# Patient Record
Sex: Female | Born: 1985
Health system: Southern US, Community
[De-identification: ages and names within clinical notes are randomized; demographics above are authoritative.]

## PROBLEM LIST (undated history)

## (undated) ENCOUNTER — Inpatient Hospital Stay (HOSPITAL_COMMUNITY): Payer: Self-pay

## (undated) DIAGNOSIS — I456 Pre-excitation syndrome: Secondary | ICD-10-CM

## (undated) DIAGNOSIS — F419 Anxiety disorder, unspecified: Secondary | ICD-10-CM

## (undated) DIAGNOSIS — Z8659 Personal history of other mental and behavioral disorders: Secondary | ICD-10-CM

## (undated) DIAGNOSIS — A630 Anogenital (venereal) warts: Secondary | ICD-10-CM

## (undated) DIAGNOSIS — Z8619 Personal history of other infectious and parasitic diseases: Secondary | ICD-10-CM

## (undated) DIAGNOSIS — I499 Cardiac arrhythmia, unspecified: Secondary | ICD-10-CM

## (undated) DIAGNOSIS — F449 Dissociative and conversion disorder, unspecified: Secondary | ICD-10-CM

## (undated) HISTORY — PX: OTHER SURGICAL HISTORY: SHX169

## (undated) HISTORY — DX: Anxiety disorder, unspecified: F41.9

## (undated) HISTORY — PX: SHOULDER SURGERY: SHX246

## (undated) HISTORY — DX: Personal history of other infectious and parasitic diseases: Z86.19

## (undated) HISTORY — DX: Pre-excitation syndrome: I45.6

## (undated) HISTORY — PX: TONSILLECTOMY: SUR1361

## (undated) HISTORY — DX: Dissociative and conversion disorder, unspecified: F44.9

## (undated) HISTORY — DX: Anogenital (venereal) warts: A63.0

## (undated) HISTORY — PX: WISDOM TOOTH EXTRACTION: SHX21

## (undated) HISTORY — DX: Personal history of other mental and behavioral disorders: Z86.59

---
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
## 2004-02-28 ENCOUNTER — Other Ambulatory Visit: Admission: RE | Admit: 2004-02-28 | Discharge: 2004-02-28 | Payer: Self-pay | Admitting: Internal Medicine

## 2005-03-01 ENCOUNTER — Other Ambulatory Visit: Admission: RE | Admit: 2005-03-01 | Discharge: 2005-03-01 | Payer: Self-pay | Admitting: Internal Medicine

## 2006-03-18 ENCOUNTER — Other Ambulatory Visit: Admission: RE | Admit: 2006-03-18 | Discharge: 2006-03-18 | Payer: Self-pay | Admitting: Internal Medicine

## 2006-05-14 ENCOUNTER — Ambulatory Visit: Payer: Self-pay | Admitting: Internal Medicine

## 2007-07-16 ENCOUNTER — Ambulatory Visit: Payer: Self-pay | Admitting: Internal Medicine

## 2007-10-08 ENCOUNTER — Ambulatory Visit: Payer: Self-pay | Admitting: Internal Medicine

## 2007-10-08 LAB — CONVERTED CEMR LAB
BUN: 7 mg/dL (ref 6–23)
Basophils Absolute: 0 10*3/uL (ref 0.0–0.1)
Basophils Relative: 0.6 % (ref 0.0–1.0)
CO2: 30 meq/L (ref 19–32)
Calcium: 9.3 mg/dL (ref 8.4–10.5)
Chloride: 105 meq/L (ref 96–112)
Creatinine, Ser: 0.7 mg/dL (ref 0.4–1.2)
Eosinophils Absolute: 0.1 10*3/uL (ref 0.0–0.6)
Eosinophils Relative: 2 % (ref 0.0–5.0)
GFR calc Af Amer: 133 mL/min
GFR calc non Af Amer: 110 mL/min
Glucose, Bld: 92 mg/dL (ref 70–99)
HCT: 39.5 % (ref 36.0–46.0)
Hemoglobin: 13.3 g/dL (ref 12.0–15.0)
INR: 1 (ref 0.8–1.0)
Lymphocytes Relative: 50 % — ABNORMAL HIGH (ref 12.0–46.0)
MCHC: 33.6 g/dL (ref 30.0–36.0)
MCV: 91.9 fL (ref 78.0–100.0)
Monocytes Absolute: 0.4 10*3/uL (ref 0.2–0.7)
Monocytes Relative: 9.6 % (ref 3.0–11.0)
Neutro Abs: 1.6 10*3/uL (ref 1.4–7.7)
Neutrophils Relative %: 37.8 % — ABNORMAL LOW (ref 43.0–77.0)
Platelets: 274 10*3/uL (ref 150–400)
Potassium: 4.1 meq/L (ref 3.5–5.1)
Prothrombin Time: 12 s (ref 10.9–13.3)
RBC: 4.3 M/uL (ref 3.87–5.11)
RDW: 12 % (ref 11.5–14.6)
Sodium: 141 meq/L (ref 135–145)
WBC: 4.2 10*3/uL — ABNORMAL LOW (ref 4.5–10.5)
aPTT: 31.9 s — ABNORMAL HIGH (ref 21.7–29.8)
hCG, Beta Chain, Quant, S: 0.72 milliintl units/mL

## 2007-10-12 ENCOUNTER — Ambulatory Visit: Payer: Self-pay | Admitting: Internal Medicine

## 2007-10-12 ENCOUNTER — Ambulatory Visit (HOSPITAL_COMMUNITY): Admission: AD | Admit: 2007-10-12 | Discharge: 2007-10-13 | Payer: Self-pay | Admitting: Internal Medicine

## 2007-11-04 ENCOUNTER — Ambulatory Visit: Payer: Self-pay | Admitting: Internal Medicine

## 2008-01-07 ENCOUNTER — Other Ambulatory Visit: Admission: RE | Admit: 2008-01-07 | Discharge: 2008-01-07 | Payer: Self-pay | Admitting: Internal Medicine

## 2008-10-20 ENCOUNTER — Encounter: Admission: RE | Admit: 2008-10-20 | Discharge: 2008-10-20 | Payer: Self-pay | Admitting: Internal Medicine

## 2009-10-19 ENCOUNTER — Other Ambulatory Visit: Admission: RE | Admit: 2009-10-19 | Discharge: 2009-10-19 | Payer: Self-pay | Admitting: Geriatric Medicine

## 2010-12-18 NOTE — H&P (Signed)
[name], [name]               ACCOUNT NO.:  [id number]   MEDICAL RECORD NO.:  [id number]          PATIENT TYPE:  OIB   LOCATION:  2899                         FACILITY:  [facility]   PHYSICIAN:  Duke Salvia, MD, FACCDATE OF BIRTH:  May 21, 1986   DATE OF ADMISSION:  10/12/2007  DATE OF DISCHARGE:                              HISTORY & PHYSICAL   PRIMARY CARE PHYSICIAN:  Georgann Housekeeper, M.D.   HISTORY OF PRESENT ILLNESS:  [name] is a 25 year old African-American  female who presents to short stay at [facility] to undergo  ablation for SVT.   The patient describes a long history of palpitations, which she  describes as a fast heart rate. These occur at least every day. She  feels they occur mostly in the morning and after approximately 10  minutes of activity. They can last anywhere from a few seconds to up to  30 minutes in duration. She states that she is unaware of all episodes,  but sometimes only becomes aware of them when she goes back to normal  rhythm. When she notices that she is having a rapid heartbeat, she tries  to relax and does breathing exercises. She does not take any  medications. She has not had any associated syncope, however, she does  describe some associated shortness of breath and needle-like chest  discomfort with palpitations. When she is not having palpitations, she  does not have any complaints.   ALLERGIES:  POULTRY (develops a headache and feels very sick), SEAFOOD  (she has edema), PENICILLIN, AUGMENTIN, ORAP (a muscle relaxer.)   MEDICATIONS:  Prior to admission, she did not take any prescription  medications, although she has been on an acne topical treatment and she  was taking Z-pack last month for a sinusitis.   PAST MEDICAL HISTORY:  1. She has a history of hyperlipidemia that is untreated.  2. She has a history of SVT. Prior echocardiogram is not available.      Dr. Mayford Knife had performed a LOOP recorder in September 2008,  specifics not available.   PAST SURGICAL HISTORY:  1. T&A in December 2001.  2. Wisdom teeth removal.   She specifically denies any diabetes mellitus, hypertension, myocardial  infarction, CVA, COPD, bleeding dyscrasias, and thyroid dysfunction.   SOCIAL HISTORY:  She resides in [place] with her mother. 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 She  specifically denies any alcohol, tobacco, drugs, or herbal medications.  Does not follow a specific diet and does not exercise regularly. She  does not have any children.   FAMILY HISTORY:  She has a mother age 75, who also had a history of SVT.  Her father is 70 with a history of obesity, obstructive sleep apnea, and  hypertension. She has a sister who is alive and well.   REVIEW OF SYSTEMS:  In addition to the above is notable for glasses,  occasional outbreaks of eczema. Sinus infection last month. Anxiety  associated with school. GERD. Her last period was last week.   PHYSICAL EXAMINATION:  GENERAL:  A  well developed, well nourished,  pleasant, African-American female in no apparent distress. Mother is  present.  HEENT:  Unremarkable.  NECK:  Supple without thyromegaly, adenopathy, JVD, or carotid bruits.  CHEST:  Medical excursion. Clear to auscultation.  HEART:  PMI is not displaced. Regular rate and rhythm. Do not appreciate  any murmurs, rubs, clicks, or gallops. All pulses are symmetrical and  intact without abdominal or femoral bruits.  SKIN:  Integument also appears to be intact.  ABDOMEN:  Slight rounded Bowel sounds present without organomegaly,  masses, or tenderness.  EXTREMITIES:  Negative for clubbing, cyanosis, or edema.  MUSCULOSKELETAL/NEUROLOGIC:  Unremarkable.   DIAGNOSTIC STUDIES:  Chest x-ray has not been performed.   EKG in the office on October 08, 2007 shows normal sinus rhythm, normal  axis, with a ventricular rate of 75. She has an early R-wave. Old EKG's  are not  available.   LABORATORY DATA:  On October 08, 2007, hemoglobin and hematocrit was 13.5  and 39.5. Normal indices. Platelets 274,000. WBC is 4.2. Sodium 141,  potassium 4.1, BUN 7, creatinine 0.7, glucose 92, PTT 31.9, PT 12.0. INR  of 1.0. HCG was 0.72.   IMPRESSION:  1. Supraventricular tachycardia. History as noted previously.   DISPOSITION:  Dr. Graciela Husbands has spoken with the patient and her mother in  regards to procedure, risks, and benefits and they both agree to proceed  as planned with radio-frequency catheter ablation for her SVT.      Joellyn Rued, PA-C      Duke Salvia, MD, [facility]  Electronically Signed    EW/MEDQ  D:  10/12/2007  T:  10/12/2007  Job:  [id number]   cc:   Georgann Housekeeper, MD  Duke Salvia, MD, [facility]

## 2010-12-18 NOTE — Discharge Summary (Signed)
[name], TAKEMOTO               ACCOUNT NO.:  [id number]   MEDICAL RECORD NO.:  [id number]          PATIENT TYPE:  OIB   LOCATION:  2023                         FACILITY:  [facility]   PHYSICIAN:  Duke Salvia, MD, FACCDATE OF BIRTH:  [birth year]   DATE OF ADMISSION:  10/12/2007  DATE OF DISCHARGE:                               DISCHARGE SUMMARY   PRIMARY CARDIOLOGIST:  Dr. Graciela Husbands   PRIMARY CARE [name]:  Dr. Arther Dames   DISCHARGE DIAGNOSIS:  Wolff-Parkinson-White syndrome/supraventricular  tachycardia.   SECONDARY DIAGNOSES:  1. Hyperlipidemia.  2. Acne.  3. Status post wisdom tooth extraction.  4. Status post tonsillectomy and adenoidectomy.   ALLERGIES:  1. POULTRY CAUSES EDEMA AND HEADACHES.  2. PENICILLIN.  3. AUGMENTIN.  4. SEAFOOD CAUSES EDEMA.  5. ORAP.   PROCEDURES:  Successful WPW/SVT radiofrequency catheter ablation.   HISTORY OF PRESENT ILLNESS:  A 25 year old female with prior history of  SVT documented by loop recorder.  She presented to [facility] October 12, 2007, for elective radiofrequency catheter ablation with Dr. Graciela Husbands.   HOSPITAL COURSE:  The patient underwent successful radiofrequency  catheter ablation October 06, 2007 for WPW/SVT.  She tolerated procedure  well.  However, postprocedure, she developed a headache.  Because she  was anticoagulated during the procedure, CT of the head was performed,  and this was negative for any acute intracranial process.  She had no  recurrence of headache overnight or this morning and has been ambulating  without difficulty.  She will be discharged home today in good  condition.   DISCHARGE LABORATORIES:  None.   DISPOSITION:  The patient is being discharged today in good condition.   FOLLOWUP PLANS AND APPOINTMENT:  She is to follow up with Dr. Sherryl Manges November 04, 2007, at 11:30 a.m.   DISCHARGE MEDICATION:  Aspirin 81 mg daily x6 weeks.   OUTSTANDING LABORATORY STUDIES:  None.   Duration of discharge  encounter:  35 minutes including physician's time.      Nicolasa Ducking, ANP      Duke Salvia, MD, [facility]  Electronically Signed    CB/MEDQ  D:  10/13/2007  T:  10/13/2007  Job:  [id number]   cc:   Georgann Housekeeper, MD

## 2010-12-18 NOTE — Assessment & Plan Note (Signed)
[facility]                         ELECTROPHYSIOLOGY OFFICE NOTE   GRAE, LEATHERS                        MRN:          [id number]  DATE:11/04/2007                            DOB:          09/30/1985    Harvie Junior is seen following ablation of a far left lateral manifest  accessory pathway.  She has had no recurrent symptoms.  She has had no  recurrent tachypalpitations.  She is having occasional palpitations,  which she feels like are the triggering beats.  She was having  tachycardia previously a couple of times a day and has had none at all  the last 3 weeks.   She will continue her aspirin for 3 weeks, endocarditis prophylaxis for  3 weeks, and then resume her previous activities.   EXAMINATION:  Her lungs were clear.  Heart sounds were regular.  The groin had a knot in the right side.  Blood pressure is 98/64.     Duke Salvia, MD, [facility]  Electronically Signed    SCK/MedQ  DD: 11/04/2007  DT: 11/05/2007  Job #: [id number]   cc:   Armanda Magic, M.D.

## 2010-12-18 NOTE — Assessment & Plan Note (Signed)
[facility]                         ELECTROPHYSIOLOGY OFFICE NOTE   [name], [name]                        MRN:          [id number]  DATE:07/16/2007                            DOB:          May 07, 1986    [name] came in today because she wanted to think about getting  ablation for her SVT.  She had been led to believe by her mom that she  would be out for about five to six weeks.  I told her she would probably  be up in four or five days, at the most.  She would like to plan to  schedule this for her spring break, as she does not have time to  schedule it between now and her Christmas break.  She is to call us and  let us know what the dates are.   I should note that she has been having daily palpitations.   On examination today, her blood pressure is 110/70, her pulse is 75.  Her lungs were clear.  Her heart sounds were regular.  Extremities were without edema.   PLAN:  We will plan to see her again about a week or two prior to her  scheduled procedure.     Duke Salvia, MD, [facility]  Electronically Signed    SCK/MedQ  DD: 07/16/2007  DT: 07/17/2007  Job #: [id number]   cc:   Georgann Housekeeper, MD

## 2010-12-18 NOTE — Op Note (Signed)
[name], [name]               ACCOUNT NO.:  [id number]   MEDICAL RECORD NO.:  [id number]          PATIENT TYPE:  OIB   LOCATION:  2899                         FACILITY:  [facility]   PHYSICIAN:  Duke Salvia, MD, FACCDATE OF BIRTH:  [birth year]   DATE OF PROCEDURE:  10/12/2007  DATE OF DISCHARGE:                               OPERATIVE REPORT   PREOPERATIVE DIAGNOSIS:  Supraventricular tachycardia.   POSTOPERATIVE DIAGNOSIS:  Wolff-Parkinson-White syndrome with  bidirectionally conducting accessory pathway located on the left free  wall.   OPERATIVE PROCEDURE:  Invasive electrophysiological study, arrhythmia  mapping.   COMPLICATIONS:  1. The patient became very cold.  There is no evidence of a drug      reaction, no evidence of infringement on the movement of the      epicardial shadow.  2. Pain during ablation while burning in the left atrium to the mitral      annulus.   Following obtaining informed consent, the patient was brought to the  electrophysiology laboratory and placed on the fluoroscopic table in  supine position.  After routine prep and drape cardiac catheterization  was performed with local anesthesia and conscious sedation.  Noninvasive  blood pressure monitoring, transcutaneous oxygen saturation monitoring  and end-tidal CO2 monitoring were performed continuously throughout the  procedure.  Following the procedure, the catheters were removed, the  sheaths were left in place, the patient was transferred to holding area  in stable condition.   CATHETERS:  A 5-French quadripolar catheter was inserted via femoral  vein to the AV junction.  A 5-French quadripolar catheter inserted via femoral vein to the right  ventricular apex.  A 6-French octapolar catheter inserted via right femoral vein to the  coronary sinus.  A 7-French 5 mm tip ablation catheter was inserted via right femoral  artery to mapping sites on the mitral annulus.   It was difficult to  access the right femoral vein.  I actually took a  wire-o-gram with a wire deployed into the right femoral venous system  from left femoral venous system and it was not passing.  When I finally  got arterial access this wire was actually lateral in a small vein  branch.  Femoral venous access was then accomplished without further  difficulty.   Following insertion of the catheters, stimulation protocol included  Incremental atrial pacing.  Incremental ventricular pacing.  Single atrial extra stimuli at a paced cycle length of 600.  Single ventricular extra stimuli at a paced cycle length of 600 and 500  milliseconds.   RESULTS:  Surface cardiogram basic intervals  Initial:  Rhythm:  Sinus; RR interval:  820 milliseconds; PR interval:  49  milliseconds; QRS duration 68 milliseconds.  QT interval 386  milliseconds; P-wave duration 110 milliseconds; bundle branch block:  Absent; pre-excitation:  Absent.  AH interval 71 milliseconds; HV interval 54 milliseconds.  Final:  Rhythm:  Sinus; RR interval 771 milliseconds; PR interval 149  milliseconds; QRS duration 93 milliseconds; QT interval is 393  milliseconds; P-wave duration was 105 milliseconds.  AH interval 84 milliseconds; HV interval was 46 milliseconds.  Evidence  of bundle branch block and pre-excitation were not evident at baseline.   AV nodal conduction antegrade AV conduction Wenckebach greater than 400  milliseconds.  VA conduction was dissociated following catheter ablation  of accessory pathway.   AV nodal conduction was continuous antegrade and was notably dissociated  post ablation.   ACCESSORY PATHWAY:  A bidirectionally conducting accessory pathway was  identified at Josephson site 6.  Antegrade ERP was at 280 milliseconds  at pace cycle length of 500 milliseconds.  The ERP for premature was 209  milliseconds and retrograde was at 300 milliseconds.   With incremental atrial pacing pre-excitation became manifest  that was  not evident at baseline.  At 700 milliseconds the HV interval was -46  milliseconds at 605 milliseconds was -77 milliseconds.   Orthodromic SVT was initiated via programmed stimulation from the  coronary sinus at 600:300 milliseconds.  Tachycardia cycle length was  approximately 350 milliseconds.  Earliest atrial activation was in the  lateral coronary sinus but VA fusion was not noted.   FLUOROSCOPY TIME:  A total of 11 minutes and 34 seconds of fluoroscopy  time was utilized at 7.5 frames per second.   RADIOFREQUENCY ENERGY:  A total of six burns for total of about 75  seconds of RF were applied at sites where atrial activation was  earliest.  Ultimately I manipulated the catheter to the left atrial site  of the mitral annulus which allowed for more ready movement of the  catheter along the annulus.  In this location RF delivered at the site  where the earliest atrial activation was noted and a possible accessory  pathway potential was identified, was associated with termination of  retrograde conduction at 5.2 seconds.  A total of 30 seconds of RF was  delivered at this point.  Antegrade conduction was also noted to be lost  when atrial pacing was tested.   IMPRESSION:  1. Normal sinus function.  2. Normal atrial function.  3. Normal AV nodal function.  4. Normal His-Purkinje system function.  5. A left lateral bidirectionally conducting accessory pathway was      identified that mediated orthodromic SVT.  RF energy applied on the      atrial side mitral annulus successfully interrupted bidirectional      conduction and eliminated the substrate.   SUMMARY:  In conclusion, results electrophysiological testing identified  a bidirectionally conducting accessory pathway on the patient's left  lateral free wall.  Catheter manipulation made it difficult to approach  this site below the mitral annulus and so catheter ablation was pursued  above the mitral annulus with the  success as noted previously.   The patient tolerated the procedure, apart from getting (a) cold and (b)  having take pain during one RF application of about 5 seconds duration  where I think that I was too far into the atrium.   Otherwise the patient tolerated the procedure without apparent  complication and was transferred to the holding area for sheath removal.      Duke Salvia, MD, [facility]  Electronically Signed     SCK/MEDQ  D:  10/12/2007  T:  10/13/2007  Job:  [id number]   cc:   Armanda Magic, M.D.  Electrophys lab

## 2010-12-21 NOTE — Letter (Signed)
May 14, 2006    Armanda Magic, M.D.  [address]  [place], [place] [address]   RE:  Kayla, [name]  MRN:  [id number]  /  DOB:  1986-07-31   Dear Gloris Manchester,   It is a pleasure to see [name] today with her mom. Both of them have  a history of tachy palpitations that are abrupt in onset and offset.   Apparently [name] were identified when she was in utero and they have  occurred all of her life since then. They currently occur 2-5 times per  week, they are abrupt in onset and offset and they last 3-90 minutes or so.  They are associated with light-headedness and some presyncope. She has never  passed out. They are associated with some chest discomfort and shortness of  breath.   I meant to ask her and unfortunately did not as to whether she had taken  medications in the past for this, but when I mentioned medication as an  option she said that was not something she wanted to do and so I assumed  that she had not.   She notes that there is some effect from caffeine. She does not use over-the-  counter cold medicines but she does note some stimulants can aggravate this.   Her cardiac evaluation has included an echocardiogram which per her report  was normal.   We also utilized an event recorded which was diagnostic and I will describe  this below.   PAST MEDICAL HISTORY:  Largely negative apart from the above. Her review of  systems is notable for environmental allergies.   PAST SURGICAL HISTORY:  Notable for tonsillectomy and wisdom tooth surgery.   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 She does not use cigarettes, alcohol or  recreational drugs and she has no children.   FAMILY HISTORY:  Notable for her mother who has similar tachy palpitations  that have been going on for the last 5 or 10 years.   MEDICATIONS:  Singulair, Zyrtec and Nasonex p.r.n.   ALLERGIES:  PENICILLIN.   PHYSICAL  EXAMINATION:  GENERAL:  She is a healthy-appearing, young, African-  American female appearing her stated age of 80.  VITAL SIGNS:  Her blood pressure was 108/74, her pulse was 75, her weight  was 153 pounds.  HEENT:  Demonstrated no __________ xanthomata.  NECK:  Neck veins were flat. Carotids were brisk and full bilaterally  without bruits.  BACK:  Without kyphosis or scoliosis.  LUNGS:  Clear.  HEART:  Sounds were regular without murmurs or gallops.  ABDOMEN:  Soft with active bowel sounds without midline pulsation or  hepatomegaly.  EXTREMITIES:  Femoral pulses were 2+, distal pulses were intact. There was  no cyanosis, clubbing or edema.  NEUROLOGIC:  Grossly normal.  SKIN:  Warm and dry.   Her electrocardiogram today demonstrated sinus rhythm without evidence of  ventricular preexcitation, the intervals were 0.13/0.08/0.38.   The vent loop recorder dated September demonstrated a narrow QRS tachycardia  the onset of which was associated with a PAC that was aberrantly conducted.  There was some acceleration of the tachycardia with a loss of aberration  that appeared to be a left bundle branch block. This may have also been  related to the initiation.   Another episode of tachycardia was identified, the onset was lost in  artifact. The cycle length was about 300 msec.  IMPRESSION:  1. Recurrent supraventricular tachycardia probably concealed left-sided      accessory pathway based on a) statistical likelihood as well as b)      cycle length diminution with loss of aberration that appears to be a      left bundle branch block aberration.  2. Otherwise healthy heart.   [name], her mother and I had a lengthy discussion regarding the presumed  mechanism underlying her arrhythmia. We discussed treatment options  including a) doing nothing, b) p.r.n. or daily AV nodal blocking agents i.e.  calcium blockers or beta blockers, the use of antiarrhythmic drugs and their  potential  for proarrhythmia as well as electrophysiological study with  radiofrequency catheter ablation. We discussed potential successes in the 95-  98% range, the likelihood of recurrence in the __________% range, the  likelihood of a catastrophic complication including heart attack, stroke  and/or dying at 08/998, heart block at about 1/100 and perforation in the  range of 1 in 250 to 350. She would like to consider this. We gave her the  teaching sheets from the [facility] on SVT and catheter  ablation. She is to contact us if she would like to pursue further  treatments.   I have reviewed the same things with her mother who probably has AV nodal  reentry based on her symptoms.   If I can assist further in their care, please do not hesitate to contact me  and thank you very much for allowing me to meet them and talk with them.    Sincerely,     ______________________________  Duke Salvia, MD, [facility]    SCK/MedQ  /  Job #:  [id number]  DD:  05/14/2006 / DT:  05/16/2006   CC:    Georgann Housekeeper, MD

## 2010-12-21 NOTE — Op Note (Signed)
[place]. [facility]  Patient:    [name], [name]                        MRN: [id number] Proc. Date: 07/25/00 Adm. Date:  [id number] Attending:  Lucky Cowboy CC:         [facility]  Linward Headland, M.D.   Operative Report  PREOPERATIVE DIAGNOSIS:  Chronic tonsillitis.  POSTOPERATIVE DIAGNOSIS:  Chronic tonsillitis.  PROCEDURE:  Tonsillectomy.  SURGEON:  Lucky Cowboy, M.D.  ANESTHESIA:  General endotracheal anesthesia.  ESTIMATED BLOOD LOSS:  10 cc.  SPECIMENS:  Tonsils.  COMPLICATIONS:  None.  INDICATIONS:  This patient is a 25 year old female, who has had at least a 2-year history of chronic sore throat.  She has been found to have chronic tonsillitis with some cryptic debris.  For these reasons, tonsillectomy was performed.  FINDINGS:  Nasopharynx was inspected and found to have no amount of adenoid hypertrophy.  There was significant intranasal edema of the turbinates.  There were a 3+ tonsils, which were cryptic and contained a significant amount of tonsillar debris.  The Harmonic scalpel was used to remove the tonsils.  PROCEDURE:  The patient was taken to the operating room and placed on the table in a supine position.  She was then placed under general endotracheal anesthesia and the table rotated counterclockwise 90 degrees.  The neck was then gently extended using a shoulder roll.  Bacitracin ointment was placed on the lips.  A Crowe-Davis mouthgag with a #3 tongue blade was then placed intraorally, opened and suspended on the Mayo stand.  Palpation of the soft palate was without evidence of a submucosal cleft.  A red rubber catheter was placed down the right nostril and brought through the oral cavity and secured in place with a hemostat.  Inspection of the nasopharynx was performed and the findings were noted as above.  The palate was relaxed and the right palatine tonsil grasped with a Allis clamp and reflected  inferomedially.  The Harmonic scalpel was then used to excise the tonsil in the peritonsillar space staying adjacent to the tonsillar capsule.  The left tonsil was removed in an identical fashion.  An NG tube was placed down the esophagus for suctioning of the gastric contents.  Mouthgag was removed noting no damage to the teeth or soft tissues.  The table was rotated clockwise 90 degrees to its original position.  The patient was awakened from anesthesia and extubated in the operating room.  She was taken to the post anesthesia care unit in stable condition.  There were no complications. DD:  07/25/00 TD:  07/26/00 Job: [id number]

## 2012-08-05 NOTE — L&D Delivery Note (Signed)
Delivery Note At 1:05 PM a viable female was delivered via Vaginal, Spontaneous Delivery (Presentation: Left Occiput Anterior).  APGAR: 8, 9; weight .   Placenta status: Intact, Spontaneous.  Cord: 3 vessels with the following complications: None.  Cord pH: NA  Anesthesia: Epidural  Episiotomy: None Lacerations: 2nd degree;Perineal;Periurethral Suture Repair: 3.0 vicryl Est. Blood Loss (mL): 200 ml   Mom to postpartum.  Baby to nursery-stable.  Lakela Kuba J. 02/23/2013, 1:35 PM

## 2012-08-12 LAB — OB RESULTS CONSOLE HIV ANTIBODY (ROUTINE TESTING): HIV: NONREACTIVE

## 2012-08-12 LAB — OB RESULTS CONSOLE ABO/RH: RH Type: POSITIVE

## 2012-08-12 LAB — OB RESULTS CONSOLE HEPATITIS B SURFACE ANTIGEN: Hepatitis B Surface Ag: NEGATIVE

## 2012-08-12 LAB — OB RESULTS CONSOLE RUBELLA ANTIBODY, IGM: Rubella: IMMUNE

## 2012-08-12 LAB — OB RESULTS CONSOLE RPR: RPR: NONREACTIVE

## 2012-08-12 LAB — OB RESULTS CONSOLE ANTIBODY SCREEN: Antibody Screen: NEGATIVE

## 2012-08-19 ENCOUNTER — Other Ambulatory Visit (HOSPITAL_COMMUNITY)
Admission: RE | Admit: 2012-08-19 | Discharge: 2012-08-19 | Disposition: A | Discharge status: Home / Self Care | Payer: BC Managed Care – PPO | Source: Ambulatory Visit | Attending: Obstetrics and Gynecology | Admitting: Obstetrics and Gynecology

## 2012-08-19 ENCOUNTER — Other Ambulatory Visit: Payer: Self-pay | Admitting: Obstetrics and Gynecology

## 2012-08-19 DIAGNOSIS — R87810 Cervical high risk human papillomavirus (HPV) DNA test positive: Secondary | ICD-10-CM | POA: Insufficient documentation

## 2012-08-19 DIAGNOSIS — Z113 Encounter for screening for infections with a predominantly sexual mode of transmission: Secondary | ICD-10-CM | POA: Insufficient documentation

## 2012-08-19 DIAGNOSIS — Z1151 Encounter for screening for human papillomavirus (HPV): Secondary | ICD-10-CM | POA: Insufficient documentation

## 2012-08-19 DIAGNOSIS — Z01419 Encounter for gynecological examination (general) (routine) without abnormal findings: Secondary | ICD-10-CM | POA: Insufficient documentation

## 2013-01-13 LAB — OB RESULTS CONSOLE GBS: GBS: POSITIVE

## 2013-02-13 ENCOUNTER — Inpatient Hospital Stay (HOSPITAL_COMMUNITY): Admission: AD | Admit: 2013-02-13 | Payer: Self-pay | Source: Ambulatory Visit | Admitting: Obstetrics and Gynecology

## 2013-02-15 ENCOUNTER — Telehealth (HOSPITAL_COMMUNITY): Payer: Self-pay | Admitting: *Deleted

## 2013-02-15 NOTE — Telephone Encounter (Signed)
Preadmission screen  

## 2013-02-17 ENCOUNTER — Encounter (HOSPITAL_COMMUNITY): Payer: Self-pay | Admitting: *Deleted

## 2013-02-18 ENCOUNTER — Other Ambulatory Visit (HOSPITAL_COMMUNITY): Payer: Self-pay | Admitting: Obstetrics and Gynecology

## 2013-02-18 DIAGNOSIS — O480 Post-term pregnancy: Secondary | ICD-10-CM

## 2013-02-19 ENCOUNTER — Ambulatory Visit (HOSPITAL_COMMUNITY): Payer: BC Managed Care – PPO

## 2013-02-19 ENCOUNTER — Ambulatory Visit (HOSPITAL_COMMUNITY)
Admission: RE | Admit: 2013-02-19 | Discharge: 2013-02-19 | Disposition: A | Discharge status: Home / Self Care | Payer: BC Managed Care – PPO | Source: Ambulatory Visit | Attending: Obstetrics and Gynecology | Admitting: Obstetrics and Gynecology

## 2013-02-19 ENCOUNTER — Other Ambulatory Visit (HOSPITAL_COMMUNITY): Payer: Self-pay | Admitting: Obstetrics and Gynecology

## 2013-02-19 DIAGNOSIS — Z3689 Encounter for other specified antenatal screening: Secondary | ICD-10-CM | POA: Insufficient documentation

## 2013-02-19 DIAGNOSIS — O480 Post-term pregnancy: Secondary | ICD-10-CM | POA: Insufficient documentation

## 2013-02-22 ENCOUNTER — Encounter (HOSPITAL_COMMUNITY): Payer: Self-pay

## 2013-02-22 ENCOUNTER — Inpatient Hospital Stay (HOSPITAL_COMMUNITY)
Admission: RE | Admit: 2013-02-22 | Discharge: 2013-02-25 | DRG: 373 | Disposition: A | Discharge status: Home / Self Care | Payer: BC Managed Care – PPO | Source: Ambulatory Visit | Attending: Obstetrics and Gynecology | Admitting: Obstetrics and Gynecology

## 2013-02-22 ENCOUNTER — Other Ambulatory Visit: Payer: Self-pay | Admitting: Obstetrics and Gynecology

## 2013-02-22 DIAGNOSIS — O99344 Other mental disorders complicating childbirth: Secondary | ICD-10-CM | POA: Diagnosis present

## 2013-02-22 DIAGNOSIS — Z22330 Carrier of Group B streptococcus: Secondary | ICD-10-CM

## 2013-02-22 DIAGNOSIS — O480 Post-term pregnancy: Principal | ICD-10-CM | POA: Diagnosis present

## 2013-02-22 DIAGNOSIS — IMO0001 Reserved for inherently not codable concepts without codable children: Secondary | ICD-10-CM

## 2013-02-22 DIAGNOSIS — F29 Unspecified psychosis not due to a substance or known physiological condition: Secondary | ICD-10-CM | POA: Diagnosis present

## 2013-02-22 DIAGNOSIS — O99892 Other specified diseases and conditions complicating childbirth: Secondary | ICD-10-CM | POA: Diagnosis present

## 2013-02-22 HISTORY — DX: Cardiac arrhythmia, unspecified: I49.9

## 2013-02-22 LAB — CBC
HCT: 36.4 % (ref 36.0–46.0)
Hemoglobin: 12.9 g/dL (ref 12.0–15.0)
MCH: 31.5 pg (ref 26.0–34.0)
MCHC: 35.4 g/dL (ref 30.0–36.0)
MCV: 88.8 fL (ref 78.0–100.0)
Platelets: 186 10*3/uL (ref 150–400)
RBC: 4.1 MIL/uL (ref 3.87–5.11)
RDW: 14.1 % (ref 11.5–15.5)
WBC: 10.5 10*3/uL (ref 4.0–10.5)

## 2013-02-22 MED ORDER — CLINDAMYCIN PHOSPHATE 900 MG/50ML IV SOLN
900.0000 mg | Freq: Three times a day (TID) | INTRAVENOUS | Status: DC
  Administered 2013-02-22 – 2013-02-23 (×2): 900 mg via INTRAVENOUS
  Filled 2013-02-22 (×4): qty 50

## 2013-02-22 MED ORDER — BUTORPHANOL TARTRATE 1 MG/ML IJ SOLN
1.0000 mg | INTRAMUSCULAR | Status: DC | PRN
  Administered 2013-02-23 (×2): 1 mg via INTRAVENOUS
  Filled 2013-02-22 (×2): qty 1

## 2013-02-22 MED ORDER — MISOPROSTOL 25 MCG QUARTER TABLET
25.0000 ug | ORAL_TABLET | ORAL | Status: DC | PRN
  Administered 2013-02-22: 25 ug via VAGINAL
  Filled 2013-02-22: qty 1
  Filled 2013-02-22: qty 0.25

## 2013-02-22 MED ORDER — IBUPROFEN 600 MG PO TABS
600.0000 mg | ORAL_TABLET | Freq: Four times a day (QID) | ORAL | Status: DC | PRN
  Administered 2013-02-23: 600 mg via ORAL
  Filled 2013-02-22: qty 1

## 2013-02-22 MED ORDER — OXYTOCIN BOLUS FROM INFUSION: 500.0000 mL | INTRAVENOUS | Status: DC

## 2013-02-22 MED ORDER — PENICILLIN G POTASSIUM 5000000 UNITS IJ SOLR
5.0000 10*6.[IU] | Freq: Once | INTRAVENOUS | Status: DC
  Filled 2013-02-22: qty 5

## 2013-02-22 MED ORDER — PENICILLIN G POTASSIUM 5000000 UNITS IJ SOLR
2.5000 10*6.[IU] | INTRAVENOUS | Status: DC
  Filled 2013-02-22 (×2): qty 2.5

## 2013-02-22 MED ORDER — OXYTOCIN 40 UNITS IN LACTATED RINGERS INFUSION - SIMPLE MED
62.5000 mL/h | INTRAVENOUS | Status: DC
  Administered 2013-02-23: 999 mL/h via INTRAVENOUS

## 2013-02-22 MED ORDER — ACETAMINOPHEN 325 MG PO TABS: 650.0000 mg | ORAL_TABLET | ORAL | Status: DC | PRN

## 2013-02-22 MED ORDER — CITRIC ACID-SODIUM CITRATE 334-500 MG/5ML PO SOLN: 30.0000 mL | ORAL | Status: DC | PRN

## 2013-02-22 MED ORDER — LACTATED RINGERS IV SOLN
INTRAVENOUS | Status: DC
  Administered 2013-02-22 – 2013-02-23 (×3): via INTRAVENOUS

## 2013-02-22 MED ORDER — TERBUTALINE SULFATE 1 MG/ML IJ SOLN: 0.2500 mg | Freq: Once | INTRAMUSCULAR | Status: AC | PRN

## 2013-02-22 MED ORDER — LACTATED RINGERS IV SOLN: 500.0000 mL | INTRAVENOUS | Status: DC | PRN

## 2013-02-22 MED ORDER — LIDOCAINE HCL (PF) 1 % IJ SOLN
30.0000 mL | INTRAMUSCULAR | Status: DC | PRN
  Filled 2013-02-22: qty 30

## 2013-02-22 MED ORDER — ONDANSETRON HCL 4 MG/2ML IJ SOLN
4.0000 mg | Freq: Four times a day (QID) | INTRAMUSCULAR | Status: DC | PRN
  Administered 2013-02-23: 4 mg via INTRAVENOUS
  Filled 2013-02-22: qty 2

## 2013-02-22 MED ORDER — OXYCODONE-ACETAMINOPHEN 5-325 MG PO TABS: 1.0000 | ORAL_TABLET | ORAL | Status: DC | PRN

## 2013-02-23 ENCOUNTER — Inpatient Hospital Stay (HOSPITAL_COMMUNITY): Payer: BC Managed Care – PPO | Admitting: Anesthesiology

## 2013-02-23 ENCOUNTER — Encounter (HOSPITAL_COMMUNITY): Payer: Self-pay

## 2013-02-23 ENCOUNTER — Encounter (HOSPITAL_COMMUNITY): Payer: Self-pay | Admitting: Anesthesiology

## 2013-02-23 DIAGNOSIS — IMO0001 Reserved for inherently not codable concepts without codable children: Secondary | ICD-10-CM

## 2013-02-23 LAB — RPR: RPR Ser Ql: NONREACTIVE

## 2013-02-23 MED ORDER — EPHEDRINE 5 MG/ML INJ
10.0000 mg | INTRAVENOUS | Status: DC | PRN
  Filled 2013-02-23: qty 4
  Filled 2013-02-23: qty 2

## 2013-02-23 MED ORDER — ONDANSETRON HCL 4 MG PO TABS: 4.0000 mg | ORAL_TABLET | ORAL | Status: DC | PRN

## 2013-02-23 MED ORDER — ONDANSETRON HCL 4 MG/2ML IJ SOLN: 4.0000 mg | INTRAMUSCULAR | Status: DC | PRN

## 2013-02-23 MED ORDER — TETANUS-DIPHTH-ACELL PERTUSSIS 5-2.5-18.5 LF-MCG/0.5 IM SUSP
0.5000 mL | Freq: Once | INTRAMUSCULAR | Status: AC
  Administered 2013-02-24: 0.5 mL via INTRAMUSCULAR
  Filled 2013-02-23: qty 0.5

## 2013-02-23 MED ORDER — PHENYLEPHRINE 40 MCG/ML (10ML) SYRINGE FOR IV PUSH (FOR BLOOD PRESSURE SUPPORT)
80.0000 ug | PREFILLED_SYRINGE | INTRAVENOUS | Status: DC | PRN
  Filled 2013-02-23: qty 5
  Filled 2013-02-23: qty 2

## 2013-02-23 MED ORDER — EPHEDRINE 5 MG/ML INJ
10.0000 mg | INTRAVENOUS | Status: DC | PRN
  Filled 2013-02-23: qty 2

## 2013-02-23 MED ORDER — LACTATED RINGERS IV SOLN
500.0000 mL | Freq: Once | INTRAVENOUS | Status: AC
  Administered 2013-02-23: 500 mL via INTRAVENOUS

## 2013-02-23 MED ORDER — TERBUTALINE SULFATE 1 MG/ML IJ SOLN: 0.2500 mg | Freq: Once | INTRAMUSCULAR | Status: DC | PRN

## 2013-02-23 MED ORDER — METHYLERGONOVINE MALEATE 0.2 MG/ML IJ SOLN: 0.2000 mg | INTRAMUSCULAR | Status: DC | PRN

## 2013-02-23 MED ORDER — LANOLIN HYDROUS EX OINT: TOPICAL_OINTMENT | CUTANEOUS | Status: DC | PRN

## 2013-02-23 MED ORDER — FENTANYL 2.5 MCG/ML BUPIVACAINE 1/10 % EPIDURAL INFUSION (WH - ANES)
14.0000 mL/h | INTRAMUSCULAR | Status: DC | PRN
  Administered 2013-02-23: 14 mL/h via EPIDURAL
  Filled 2013-02-23: qty 125

## 2013-02-23 MED ORDER — DIBUCAINE 1 % RE OINT
1.0000 "application " | TOPICAL_OINTMENT | RECTAL | Status: DC | PRN
  Administered 2013-02-23: 1 via RECTAL
  Filled 2013-02-23: qty 28

## 2013-02-23 MED ORDER — DIPHENHYDRAMINE HCL 50 MG/ML IJ SOLN: 12.5000 mg | INTRAMUSCULAR | Status: DC | PRN

## 2013-02-23 MED ORDER — LIDOCAINE HCL (PF) 1 % IJ SOLN
INTRAMUSCULAR | Status: DC | PRN
  Administered 2013-02-23 (×4): 4 mL

## 2013-02-23 MED ORDER — OXYCODONE-ACETAMINOPHEN 5-325 MG PO TABS: 1.0000 | ORAL_TABLET | ORAL | Status: DC | PRN

## 2013-02-23 MED ORDER — PHENYLEPHRINE 40 MCG/ML (10ML) SYRINGE FOR IV PUSH (FOR BLOOD PRESSURE SUPPORT)
80.0000 ug | PREFILLED_SYRINGE | INTRAVENOUS | Status: DC | PRN
  Filled 2013-02-23: qty 2

## 2013-02-23 MED ORDER — WITCH HAZEL-GLYCERIN EX PADS
1.0000 "application " | MEDICATED_PAD | CUTANEOUS | Status: DC | PRN
  Administered 2013-02-23: 1 via TOPICAL

## 2013-02-23 MED ORDER — BENZOCAINE-MENTHOL 20-0.5 % EX AERO
1.0000 "application " | INHALATION_SPRAY | CUTANEOUS | Status: DC | PRN
  Administered 2013-02-23: 1 via TOPICAL
  Filled 2013-02-23: qty 224

## 2013-02-23 MED ORDER — DIPHENHYDRAMINE HCL 25 MG PO CAPS: 25.0000 mg | ORAL_CAPSULE | Freq: Four times a day (QID) | ORAL | Status: DC | PRN

## 2013-02-23 MED ORDER — LORATADINE 10 MG PO TABS
10.0000 mg | ORAL_TABLET | Freq: Every day | ORAL | Status: DC
  Filled 2013-02-23 (×3): qty 1

## 2013-02-23 MED ORDER — METHYLERGONOVINE MALEATE 0.2 MG PO TABS: 0.2000 mg | ORAL_TABLET | ORAL | Status: DC | PRN

## 2013-02-23 MED ORDER — SIMETHICONE 80 MG PO CHEW: 80.0000 mg | CHEWABLE_TABLET | ORAL | Status: DC | PRN

## 2013-02-23 MED ORDER — OXYTOCIN 40 UNITS IN LACTATED RINGERS INFUSION - SIMPLE MED
1.0000 m[IU]/min | INTRAVENOUS | Status: DC
  Administered 2013-02-23: 1 m[IU]/min via INTRAVENOUS
  Filled 2013-02-23: qty 1000

## 2013-02-23 MED ORDER — ZOLPIDEM TARTRATE 5 MG PO TABS: 5.0000 mg | ORAL_TABLET | Freq: Every evening | ORAL | Status: DC | PRN

## 2013-02-23 MED ORDER — IBUPROFEN 600 MG PO TABS
600.0000 mg | ORAL_TABLET | Freq: Four times a day (QID) | ORAL | Status: DC
  Administered 2013-02-24 – 2013-02-25 (×6): 600 mg via ORAL
  Filled 2013-02-23 (×7): qty 1

## 2013-02-23 MED ORDER — SENNOSIDES-DOCUSATE SODIUM 8.6-50 MG PO TABS
2.0000 | ORAL_TABLET | Freq: Every day | ORAL | Status: DC
  Administered 2013-02-23 – 2013-02-24 (×2): 2 via ORAL

## 2013-02-23 NOTE — H&P (Signed)
[name] is a 27 y.o. female presenting  At 41 wks and 2 days for post dates induction. Pt has h/o psychosis. Denies current auditory or visual hallucinations.. She is followed by a neuropsychitrist and Dr. Larry Sierras with [facility] for [facility]selling.  + FM no regular contractions on admission  History OB History   Grav Para Term Preterm Abortions TAB SAB Ect Mult Living   1              Past Medical History  Diagnosis Date  . Mental disorder     psychosis/conversion disorder  . Condyloma   . Hx of varicella   . Dysrhythmia    Past Surgical History  Procedure Laterality Date  . Shoulder surgery    . Heart ablation    . Tonsillectomy    . Wisdom tooth extraction     Family History: family history includes Anxiety disorder in her paternal aunt; Asthma in her mother; Cancer in her maternal grandfather; Depression in her other; Diabetes in her mother; Heart disease in her maternal grandfather; Hypertension in her father and maternal grandmother; and Stroke in her maternal grandmother. Social History:  reports that she has never smoked. She has never used smokeless tobacco. She reports that  drinks alcohol. She reports that she does not use illicit drugs.   Prenatal Transfer Tool  Maternal Diabetes: No Genetic Screening: Normal Maternal Ultrasounds/Referrals: Normal Fetal Ultrasounds or other Referrals:  None Maternal Substance Abuse:  No Significant Maternal Medications:  None Significant Maternal Lab Results:  Lab values include: Group B Strep positive Other Comments:  None  Review of Systems  All other systems reviewed and are negative.    Dilation: 10 Effacement (%): 100 Station: +2;+3 Exam by:: S Earl RN Blood pressure 120/62, pulse 101, temperature 98.7 F (37.1 C), temperature source Oral, resp. rate 18, height 5\' 7"  (1.702 m), weight 95.255 kg (210 lb), last menstrual period 05/09/2012, SpO2 100.00%. Exam Physical Exam  Constitutional: She is oriented to  person, place, and time. She appears well-developed and well-nourished.  Neck: Normal range of motion.  Cardiovascular: Normal rate and regular rhythm.   Respiratory: Effort normal.  Musculoskeletal: Normal range of motion. She exhibits edema.  Neurological: She is alert and oriented to person, place, and time.  Skin: Skin is warm.  Psychiatric: She has a normal mood and affect. Her behavior is normal. Judgment and thought content normal.    Prenatal labs: ABO, Rh: AB/Positive/-- (01/08 0000) Antibody: Negative (01/08 0000) Rubella: Immune (01/08 0000) RPR: NON REACTIVE (07/21 2010)  HBsAg: Negative (01/08 0000)  HIV: Non-reactive (01/08 0000)  GBS: Positive (06/11 0000)   Assessment/Plan: 41 wks  2 days  Post dates for induction.  Received cytotec overnight.  Arom performed at 745 this morning clear fluid..  Augmentation with pitocin.  SVD H/O psychosis.. Pt seeing neuropsychiatris and counsellor... Social work consult during this admission    Sy Saintjean J. 02/23/2013, 1:30 PM

## 2013-02-23 NOTE — Progress Notes (Signed)
Pt contracting too much to insert next dose of cytotec.  Will continue to monitor

## 2013-02-23 NOTE — Progress Notes (Signed)
Pt reports that her water broke.  SVE.  BBOW noted.  Dr Richardson Dopp on unit.  Updated on pt status.

## 2013-02-23 NOTE — Anesthesia Preprocedure Evaluation (Signed)
Anesthesia Evaluation  Patient identified by MRN, date of birth, ID band Patient awake    Reviewed: Allergy & Precautions, H&P , NPO status , Patient's Chart, lab work & pertinent test results, reviewed documented beta blocker date and time   History of Anesthesia Complications Negative for: history of anesthetic complications  Airway Mallampati: II TM Distance: >3 FB Neck ROM: full    Dental  (+) Teeth Intact   Pulmonary neg pulmonary ROS,  breath sounds clear to auscultation        Cardiovascular + dysrhythmias (h/o WPW, s/p ablation 2009) Rhythm:regular Rate:Normal     Neuro/Psych PSYCHIATRIC DISORDERS (h/o psychosis, conversion disorder) negative neurological ROS     GI/Hepatic negative GI ROS, Neg liver ROS,   Endo/Other  BMI 33  Renal/GU negative Renal ROS     Musculoskeletal   Abdominal   Peds  Hematology negative hematology ROS (+)   Anesthesia Other Findings Shellfish allergy - skin reacts if she touches it  Reproductive/Obstetrics (+) Pregnancy                           Anesthesia Physical Anesthesia Plan  ASA: II  Anesthesia Plan: Epidural   Post-op Pain Management:    Induction:   Airway Management Planned:   Additional Equipment:   Intra-op Plan:   Post-operative Plan:   Informed Consent: I have reviewed the patients History and Physical, chart, labs and discussed the procedure including the risks, benefits and alternatives for the proposed anesthesia with the patient or authorized representative who has indicated his/her understanding and acceptance.     Plan Discussed with:   Anesthesia Plan Comments:         Anesthesia Quick Evaluation

## 2013-02-23 NOTE — Anesthesia Procedure Notes (Signed)
Epidural Patient location during procedure: OB Start time: 02/23/2013 5:37 AM  Staffing Performed by: anesthesiologist   Preanesthetic Checklist Completed: patient identified, site marked, surgical consent, pre-op evaluation, timeout performed, IV checked, risks and benefits discussed and monitors and equipment checked  Epidural Patient position: sitting Prep: site prepped and draped and DuraPrep Patient monitoring: continuous pulse ox and blood pressure Approach: midline Injection technique: LOR air  Needle:  Needle type: Tuohy  Needle gauge: 17 G Needle length: 9 cm and 9 Needle insertion depth: 5.5 cm Catheter type: closed end flexible Catheter size: 19 Gauge Catheter at skin depth: 10.5 cm Test dose: negative  Assessment Events: blood not aspirated, injection not painful, no injection resistance, negative IV test and no paresthesia  Additional Notes Discussed risk of headache, infection, bleeding, nerve injury and failed or incomplete block.  Patient voices understanding and wishes to proceed.  Epidural placed easily on first attempt.  No paresthesia.  Patient tolerated procedure well with no apparent complications.  Jasmine December, MD Reason for block:procedure for pain

## 2013-02-24 LAB — CBC
HCT: 32.3 % — ABNORMAL LOW (ref 36.0–46.0)
Hemoglobin: 11.4 g/dL — ABNORMAL LOW (ref 12.0–15.0)
MCH: 31.4 pg (ref 26.0–34.0)
MCHC: 35.3 g/dL (ref 30.0–36.0)
MCV: 89 fL (ref 78.0–100.0)
Platelets: 172 10*3/uL (ref 150–400)
RBC: 3.63 MIL/uL — ABNORMAL LOW (ref 3.87–5.11)
RDW: 14.3 % (ref 11.5–15.5)
WBC: 12.7 10*3/uL — ABNORMAL HIGH (ref 4.0–10.5)

## 2013-02-24 NOTE — Progress Notes (Addendum)
Clinical Social Work Department PSYCHOSOCIAL ASSESSMENT - MATERNAL/CHILD 02/24/2013  Patient:  [name],Kayla L  Account Number:  [id number]  Admit Date:  02/22/2013  Childs Name:   [name]    Clinical Social Worker:  Taraoluwa Thakur, LCSW   Date/Time:  02/24/2013 10:00 AM  Date Referred:  02/24/2013   Referral source  CN     Referred reason  Behavioral Health Issues   Other referral source:    I:  FAMILY / HOME ENVIRONMENT Child's legal guardian:  PARENT  Guardian - Name Guardian - Age Guardian - Address  [name] 27 [address] [place], [place] [address]  [name]  same   Other household support members/support persons Name Relationship DOB   MOTHER    Other support:   Parents report having a good support system.  MOB states they live with her mother at this time, who is a good support person.    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  She works part time at [employer].  She will be looking for work as soon as she feels she can return to work after maternity leave.  FOB-Assistanct Manager at [employer]   Financial resources:  Medicaid If Medicaid - County:  [place] Other  WIC  Food Stamps   School / Grade:   Maternity Care Coordinator / Child Services Coordination / Early Interventions:   CC4C  Cultural issues impacting care:   None stated    III  STRENGTHS Strengths  Adequate Resources  Compliance with medical plan  Home prepared for Child (including basic supplies)  Other - See comment  Supportive family/friends   Strength comment:  Pediatric follow up will be at [facility].   IV  RISK FACTORS AND CURRENT PROBLEMS Current Problem:  YES   Risk Factor & Current Problem Patient Issue Family Issue Risk Factor / Current Problem Comment  Mental Illness Y N MOB has been diagnosed with  Conversion Disorder.    V  SOCIAL WORK ASSESSMENT  CSW met with parents in MOB's first floor room/148 to complete assessment for behavioral health issues impacting hospitalization.  Parents were very pleasant and welcoming of CSW.  MOB spoke more than FOB, but he appeared involved and supportive.  He held baby while we talked.  Parents report having a good support system and everything they need for baby at home.  They are somewhat disappointed that they are still living with MGM since they hoped to find a place of their own before having a baby.  They realize that this change in their plans can be positive, since they think MGM will be a big help to them with the baby.  MOB was very open about her mental health hx.  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  She states her symptoms became physical and she was diagnosed with Conversion Disorder.  She was also experiencing auditory and visual hallucinations at times and diagnosed with Schizophrenia.  She states she was given a prescription for Risperdol, but was not interested in taking medication.  She states she sees a therapist [facility] the [facility], Ms. Harris and denies any AH/VH at this time.  She has a psychiatrist, Dr. Akintayo, who she can call if needed, but feels she does not have any need to see him at this time.  CSW talked in depth about PPD and asked her to please contact him or her OB's office if she has concerns.  She agreed.  MOB acknowledged the severity of her symptoms in the past, but states she is doing very well and has no current concerns and seemed to be appropriately coping at this time.  She seemed appreciative of CSW's concern for her mental and emotional wellbeing and states she will call her  doctor if PPD symptoms arise.  CSW identifies no further questions or barriers to discharge.   VI SOCIAL WORK PLAN Social Work Plan  No Further Intervention Required / No Barriers to Discharge   Type of pt/family education:   Importance of Mental Health care  PPD signs and symptoms   If child protective services report - county:   If child protective services report - date:   Information/referral to community resources comment:   CC4C   Other social work plan:    

## 2013-02-24 NOTE — Progress Notes (Signed)
Post Partum Day 1 s/p svd  Subjective: no complaints, up ad lib, voiding and tolerating PO  Objective: Blood pressure 109/67, pulse 73, temperature 97.7 F (36.5 C), temperature source Oral, resp. rate 18, height 5\' 7"  (1.702 m), weight 95.255 kg (210 lb), last menstrual period 05/09/2012, SpO2 99.00%, unknown if currently breastfeeding.  Physical Exam:  General: alert and cooperative Lochia: appropriate Uterine Fundus: firm Incision: NA DVT Evaluation: No evidence of DVT seen on physical exam.   Recent Labs  02/22/13 2010 02/24/13 0555  HGB 12.9 11.4*  HCT 36.4 32.3*    Assessment/Plan: Plan for discharge tomorrow and Lactation consult   LOS: 2 days   [name] J. 02/24/2013, 1:19 PM

## 2013-02-24 NOTE — Anesthesia Postprocedure Evaluation (Signed)
Anesthesia Post Note  Patient: [name]  Procedure(s) Performed: * No procedures listed *  Anesthesia type: Epidural  Patient location: Mother/Baby  Post pain: Pain level controlled  Post assessment: Post-op Vital signs reviewed  Last Vitals:  Filed Vitals:   02/24/13 0532  BP: 109/67  Pulse: 73  Temp: 36.5 C  Resp: 18    Post vital signs: Reviewed  Level of consciousness:alert  Complications: No apparent anesthesia complications

## 2013-02-25 MED ORDER — IBUPROFEN 600 MG PO TABS: 600.0000 mg | ORAL_TABLET | Freq: Four times a day (QID) | ORAL | Status: DC | PRN

## 2013-02-25 NOTE — Discharge Summary (Signed)
Obstetric Discharge Summary Reason for Admission: induction of labor Prenatal Procedures: none Intrapartum Procedures: spontaneous vaginal delivery Postpartum Procedures: none Complications-Operative and Postpartum: none Hemoglobin  Date Value Range Status  02/24/2013 11.4* 12.0 - 15.0 g/dL Final     HCT  Date Value Range Status  02/24/2013 32.3* 36.0 - 46.0 % Final    Physical Exam:  General: alert and cooperative Lochia: appropriate Uterine Fundus: firm Incision: NA DVT Evaluation: No evidence of DVT seen on physical exam.  Discharge Diagnoses: Term Pregnancy-delivered  Discharge Information: Date: 02/25/2013 Activity: pelvic rest Diet: routine Medications: PNV and Ibuprofen Condition: stable Instructions: refer to practice specific booklet Discharge to: home Follow-up Information   Follow up with Jessee Avers., MD. Schedule an appointment as soon as possible for a visit in 2 weeks. (postpartum visit )    Contact information:   [address] [place] [address] [phone]       Newborn Data: Live born female  Birth Weight: 7 lb 7.8 oz (3396 g) APGAR: 8, 9  Home with mother.  [name] J. 02/25/2013, 9:52 AM

## 2013-08-31 ENCOUNTER — Other Ambulatory Visit (HOSPITAL_COMMUNITY)
Admission: RE | Admit: 2013-08-31 | Discharge: 2013-08-31 | Disposition: A | Discharge status: Home / Self Care | Payer: BC Managed Care – PPO | Source: Ambulatory Visit | Attending: Obstetrics and Gynecology | Admitting: Obstetrics and Gynecology

## 2013-08-31 ENCOUNTER — Other Ambulatory Visit: Payer: Self-pay | Admitting: Obstetrics and Gynecology

## 2013-08-31 DIAGNOSIS — Z01419 Encounter for gynecological examination (general) (routine) without abnormal findings: Secondary | ICD-10-CM | POA: Insufficient documentation

## 2013-09-27 ENCOUNTER — Emergency Department (HOSPITAL_COMMUNITY): Payer: BC Managed Care – PPO

## 2013-09-27 ENCOUNTER — Emergency Department (HOSPITAL_COMMUNITY)
Admission: EM | Admit: 2013-09-27 | Discharge: 2013-09-28 | Disposition: A | Discharge status: Home / Self Care | Payer: BC Managed Care – PPO | Attending: Emergency Medicine | Admitting: Emergency Medicine

## 2013-09-27 ENCOUNTER — Encounter (HOSPITAL_COMMUNITY): Payer: Self-pay | Admitting: Emergency Medicine

## 2013-09-27 DIAGNOSIS — Z8619 Personal history of other infectious and parasitic diseases: Secondary | ICD-10-CM | POA: Insufficient documentation

## 2013-09-27 DIAGNOSIS — S39012A Strain of muscle, fascia and tendon of lower back, initial encounter: Secondary | ICD-10-CM

## 2013-09-27 DIAGNOSIS — S161XXA Strain of muscle, fascia and tendon at neck level, initial encounter: Secondary | ICD-10-CM

## 2013-09-27 DIAGNOSIS — S335XXA Sprain of ligaments of lumbar spine, initial encounter: Secondary | ICD-10-CM | POA: Insufficient documentation

## 2013-09-27 DIAGNOSIS — S139XXA Sprain of joints and ligaments of unspecified parts of neck, initial encounter: Secondary | ICD-10-CM | POA: Insufficient documentation

## 2013-09-27 DIAGNOSIS — Z79899 Other long term (current) drug therapy: Secondary | ICD-10-CM | POA: Insufficient documentation

## 2013-09-27 DIAGNOSIS — Y9389 Activity, other specified: Secondary | ICD-10-CM | POA: Insufficient documentation

## 2013-09-27 DIAGNOSIS — Y92410 Unspecified street and highway as the place of occurrence of the external cause: Secondary | ICD-10-CM | POA: Insufficient documentation

## 2013-09-27 DIAGNOSIS — Z8659 Personal history of other mental and behavioral disorders: Secondary | ICD-10-CM | POA: Insufficient documentation

## 2013-09-27 MED ORDER — IBUPROFEN 200 MG PO TABS
600.0000 mg | ORAL_TABLET | Freq: Once | ORAL | Status: AC
  Administered 2013-09-27: 600 mg via ORAL
  Filled 2013-09-27: qty 3

## 2013-09-27 MED ORDER — ORPHENADRINE CITRATE ER 100 MG PO TB12
100.0000 mg | ORAL_TABLET | Freq: Two times a day (BID) | ORAL | Status: DC
  Administered 2013-09-27: 100 mg via ORAL
  Filled 2013-09-27: qty 1
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
## 2013-09-27 NOTE — ED Notes (Signed)
Patient transported to X-ray 
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
## 2013-09-28 MED ORDER — IBUPROFEN 600 MG PO TABS: 600.0000 mg | ORAL_TABLET | Freq: Four times a day (QID) | ORAL | Status: DC | PRN

## 2013-09-28 MED ORDER — ORPHENADRINE CITRATE ER 100 MG PO TB12: 100.0000 mg | ORAL_TABLET | Freq: Two times a day (BID) | ORAL | Status: DC

## 2013-09-28 NOTE — ED Provider Notes (Signed)
Medical screening examination/treatment/procedure(s) were performed by non-physician practitioner and as supervising physician I was immediately available for consultation/collaboration.    Pavneet Markwood, MD 09/28/13 0703 

## 2014-06-06 ENCOUNTER — Encounter (HOSPITAL_COMMUNITY): Payer: Self-pay | Admitting: Emergency Medicine

## 2014-08-22 ENCOUNTER — Other Ambulatory Visit (HOSPITAL_COMMUNITY)
Admission: RE | Admit: 2014-08-22 | Discharge: 2014-08-22 | Disposition: A | Discharge status: Home / Self Care | Payer: BLUE CROSS/BLUE SHIELD | Source: Ambulatory Visit | Attending: Obstetrics and Gynecology | Admitting: Obstetrics and Gynecology

## 2014-08-22 ENCOUNTER — Other Ambulatory Visit: Payer: Self-pay | Admitting: Obstetrics and Gynecology

## 2014-08-22 DIAGNOSIS — Z01419 Encounter for gynecological examination (general) (routine) without abnormal findings: Secondary | ICD-10-CM | POA: Diagnosis present

## 2014-08-23 LAB — CYTOLOGY - PAP

## 2015-08-29 ENCOUNTER — Other Ambulatory Visit: Payer: Self-pay | Admitting: Obstetrics and Gynecology

## 2015-08-29 ENCOUNTER — Other Ambulatory Visit (HOSPITAL_COMMUNITY)
Admission: RE | Admit: 2015-08-29 | Discharge: 2015-08-29 | Disposition: A | Discharge status: Home / Self Care | Payer: BLUE CROSS/BLUE SHIELD | Source: Ambulatory Visit | Attending: Obstetrics and Gynecology | Admitting: Obstetrics and Gynecology

## 2015-08-29 DIAGNOSIS — Z01419 Encounter for gynecological examination (general) (routine) without abnormal findings: Secondary | ICD-10-CM | POA: Insufficient documentation

## 2015-08-29 DIAGNOSIS — Z1151 Encounter for screening for human papillomavirus (HPV): Secondary | ICD-10-CM | POA: Insufficient documentation

## 2015-08-31 LAB — CYTOLOGY - PAP

## 2016-02-29 LAB — OB RESULTS CONSOLE RPR: RPR: NONREACTIVE

## 2016-02-29 LAB — OB RESULTS CONSOLE HEPATITIS B SURFACE ANTIGEN: Hepatitis B Surface Ag: NEGATIVE

## 2016-02-29 LAB — OB RESULTS CONSOLE HIV ANTIBODY (ROUTINE TESTING): HIV: NONREACTIVE

## 2016-02-29 LAB — OB RESULTS CONSOLE RUBELLA ANTIBODY, IGM: Rubella: IMMUNE

## 2016-02-29 LAB — OB RESULTS CONSOLE GC/CHLAMYDIA: Gonorrhea: NEGATIVE

## 2016-04-01 LAB — OB RESULTS CONSOLE GBS: GBS: POSITIVE

## 2016-08-05 NOTE — L&D Delivery Note (Signed)
Delivery Note At 4:12 PM a viable female was delivered via Vaginal, Spontaneous Delivery (Presentation: occiput anterior ;  ).  APGAR: 8, 9; weight  pending .   Placenta status: , intact. 3 vessel  Cord:  with the following complications: None.  Cord pH: NA  Anesthesia:   Episiotomy: None Lacerations: None Suture Repair: NA Est. Blood Loss (mL): 300  Mom to postpartum.  Baby to Couplet care / Skin to Skin.  Rileigh Kawashima J. 11/06/2016, 5:39 PM

## 2016-10-22 ENCOUNTER — Encounter (HOSPITAL_COMMUNITY): Payer: Self-pay | Admitting: *Deleted

## 2016-10-22 ENCOUNTER — Inpatient Hospital Stay (HOSPITAL_COMMUNITY)
Admission: AD | Admit: 2016-10-22 | Discharge: 2016-10-22 | Disposition: A | Discharge status: Home / Self Care | Payer: 59 | Source: Ambulatory Visit | Attending: Obstetrics and Gynecology | Admitting: Obstetrics and Gynecology

## 2016-10-22 ENCOUNTER — Inpatient Hospital Stay (HOSPITAL_COMMUNITY): Payer: 59

## 2016-10-22 DIAGNOSIS — O368990 Maternal care for other specified fetal problems, unspecified trimester, not applicable or unspecified: Secondary | ICD-10-CM | POA: Diagnosis not present

## 2016-10-22 DIAGNOSIS — Z3A38 38 weeks gestation of pregnancy: Secondary | ICD-10-CM | POA: Insufficient documentation

## 2016-10-22 DIAGNOSIS — O368390 Maternal care for abnormalities of the fetal heart rate or rhythm, unspecified trimester, not applicable or unspecified: Secondary | ICD-10-CM

## 2016-10-22 NOTE — Progress Notes (Signed)
Pt signed paper copy, E signature not working in room.

## 2016-10-22 NOTE — MAU Note (Signed)
Pt saw Dr. Richardson Doppole today, was monitored & told to come to MAU.  Having some contractions.  Denies bleeding or LOF.

## 2016-10-22 NOTE — Discharge Instructions (Signed)
Fetal Movement Counts  Patient Name: ________________________________________________ Patient Due Date: ____________________  What is a fetal movement count?  A fetal movement count is the number of times that you feel your baby move during a certain amount of time. This may also be called a fetal kick count. A fetal movement count is recommended for every pregnant woman. You may be asked to start counting fetal movements as early as week 28 of your pregnancy.  Pay attention to when your baby is most active. You may notice your baby's sleep and wake cycles. You may also notice things that make your baby move more. You should do a fetal movement count:  · When your baby is normally most active.  · At the same time each day.    A good time to count movements is while you are resting, after having something to eat and drink.  How do I count fetal movements?  1. Find a quiet, comfortable area. Sit, or lie down on your side.  2. Write down the date, the start time and stop time, and the number of movements that you felt between those two times. Take this information with you to your health care visits.  3. For 2 hours, count kicks, flutters, swishes, rolls, and jabs. You should feel at least 10 movements during 2 hours.  4. You may stop counting after you have felt 10 movements.  5. If you do not feel 10 movements in 2 hours, have something to eat and drink. Then, keep resting and counting for 1 hour. If you feel at least 4 movements during that hour, you may stop counting.  Contact a health care provider if:  · You feel fewer than 4 movements in 2 hours.  · Your baby is not moving like he or she usually does.  Date: ____________ Start time: ____________ Stop time: ____________ Movements: ____________  Date: ____________ Start time: ____________ Stop time: ____________ Movements: ____________  Date: ____________ Start time: ____________ Stop time: ____________ Movements: ____________  Date: ____________ Start time:  ____________ Stop time: ____________ Movements: ____________  Date: ____________ Start time: ____________ Stop time: ____________ Movements: ____________  Date: ____________ Start time: ____________ Stop time: ____________ Movements: ____________  Date: ____________ Start time: ____________ Stop time: ____________ Movements: ____________  Date: ____________ Start time: ____________ Stop time: ____________ Movements: ____________  Date: ____________ Start time: ____________ Stop time: ____________ Movements: ____________  This information is not intended to replace advice given to you by your health care provider. Make sure you discuss any questions you have with your health care provider.  Document Released: 08/21/2006 Document Revised: 03/20/2016 Document Reviewed: 08/31/2015  Elsevier Interactive Patient Education © 2017 Elsevier Inc[employer]

## 2016-10-22 NOTE — MAU Provider Note (Signed)
Chief Complaint  Patient presents with  . fetal monitoring     First Provider Initiated Contact with Patient 10/22/16 2021      S: [name]  is a 31 y.o. y.o. year old 222P1001 female at 7349w2d weeks gestation who [facility] possible FHR decels. Was receiving NST initially for tachycardia (resolved).    Contractions: Rare, mild Vaginal bleeding: none Leaking of fluid: none  O:  Patient Vitals for the past 24 hrs:  BP Temp Temp src Pulse Resp  10/22/16 2034 108/71 - Oral 94 18  10/22/16 1837 103/69 98.6 F (37 C) Oral (!) 110 18  10/22/16 1818 113/75 98.4 F (36.9 C) Oral (!) 112 18   General: NAD Heart: Regular rate Lungs: Normal rate and effort Abd: Soft, NT, Gravid, S=D Pelvic: Deferred   NST performed EFM: 140, reactive, no decels Toco: rare, mild  BPP 8/8, AFI 7 cm, Vtx  A: 8449w2d week IUP 1. Variable fetal heart rate decelerations, antepartum--None in MAU. Overall reassuring fetal status   P: Discharge home in stable condition Geryl RankinsEvelyn Varnado, MD. Labor precautions and fetal kick counts. Follow-up as scheduled for prenatal visit or sooner as needed if symptoms worsen. Return to maternity admissions as needed if symptoms worsen.  [place] Anabella Capshaw, [place] 10/22/2016 8:31 PM  2

## 2016-11-01 ENCOUNTER — Telehealth (HOSPITAL_COMMUNITY): Payer: Self-pay | Admitting: *Deleted

## 2016-11-01 ENCOUNTER — Encounter (HOSPITAL_COMMUNITY): Payer: Self-pay | Admitting: *Deleted

## 2016-11-01 NOTE — Telephone Encounter (Signed)
Preadmission screen  

## 2016-11-04 ENCOUNTER — Other Ambulatory Visit: Payer: Self-pay | Admitting: Obstetrics and Gynecology

## 2016-11-06 ENCOUNTER — Inpatient Hospital Stay (HOSPITAL_COMMUNITY)
Admission: RE | Admit: 2016-11-06 | Discharge: 2016-11-08 | DRG: 775 | Disposition: A | Discharge status: Home / Self Care | Payer: 59 | Source: Ambulatory Visit | Attending: Obstetrics and Gynecology | Admitting: Obstetrics and Gynecology

## 2016-11-06 ENCOUNTER — Inpatient Hospital Stay (HOSPITAL_COMMUNITY): Payer: 59 | Admitting: Anesthesiology

## 2016-11-06 ENCOUNTER — Encounter (HOSPITAL_COMMUNITY): Payer: Self-pay

## 2016-11-06 DIAGNOSIS — Z823 Family history of stroke: Secondary | ICD-10-CM | POA: Diagnosis not present

## 2016-11-06 DIAGNOSIS — Z880 Allergy status to penicillin: Secondary | ICD-10-CM | POA: Diagnosis not present

## 2016-11-06 DIAGNOSIS — O9902 Anemia complicating childbirth: Secondary | ICD-10-CM | POA: Diagnosis present

## 2016-11-06 DIAGNOSIS — F449 Dissociative and conversion disorder, unspecified: Secondary | ICD-10-CM | POA: Diagnosis present

## 2016-11-06 DIAGNOSIS — O134 Gestational [pregnancy-induced] hypertension without significant proteinuria, complicating childbirth: Secondary | ICD-10-CM | POA: Diagnosis present

## 2016-11-06 DIAGNOSIS — O480 Post-term pregnancy: Secondary | ICD-10-CM | POA: Diagnosis present

## 2016-11-06 DIAGNOSIS — D649 Anemia, unspecified: Secondary | ICD-10-CM | POA: Diagnosis present

## 2016-11-06 DIAGNOSIS — O99824 Streptococcus B carrier state complicating childbirth: Secondary | ICD-10-CM | POA: Diagnosis present

## 2016-11-06 DIAGNOSIS — Z8249 Family history of ischemic heart disease and other diseases of the circulatory system: Secondary | ICD-10-CM

## 2016-11-06 DIAGNOSIS — Z833 Family history of diabetes mellitus: Secondary | ICD-10-CM

## 2016-11-06 DIAGNOSIS — O99344 Other mental disorders complicating childbirth: Secondary | ICD-10-CM | POA: Diagnosis present

## 2016-11-06 DIAGNOSIS — Z3A40 40 weeks gestation of pregnancy: Secondary | ICD-10-CM

## 2016-11-06 LAB — CBC
HCT: 34 % — ABNORMAL LOW (ref 36.0–46.0)
Hemoglobin: 11.5 g/dL — ABNORMAL LOW (ref 12.0–15.0)
MCH: 28.3 pg (ref 26.0–34.0)
MCHC: 33.8 g/dL (ref 30.0–36.0)
MCV: 83.5 fL (ref 78.0–100.0)
Platelets: 220 K/uL (ref 150–400)
RBC: 4.07 MIL/uL (ref 3.87–5.11)
RDW: 14.8 % (ref 11.5–15.5)
WBC: 7.9 K/uL (ref 4.0–10.5)

## 2016-11-06 LAB — TYPE AND SCREEN
ABO/RH(D): AB POS
Antibody Screen: NEGATIVE

## 2016-11-06 MED ORDER — SIMETHICONE 80 MG PO CHEW: 80.0000 mg | CHEWABLE_TABLET | ORAL | Status: DC | PRN

## 2016-11-06 MED ORDER — EPHEDRINE 5 MG/ML INJ
10.0000 mg | INTRAVENOUS | Status: DC | PRN
  Filled 2016-11-06: qty 2

## 2016-11-06 MED ORDER — CEFAZOLIN SODIUM-DEXTROSE 2-4 GM/100ML-% IV SOLN
2.0000 g | Freq: Once | INTRAVENOUS | Status: AC
  Administered 2016-11-06: 2 g via INTRAVENOUS
  Filled 2016-11-06: qty 100

## 2016-11-06 MED ORDER — LACTATED RINGERS IV SOLN
INTRAVENOUS | Status: DC
  Administered 2016-11-06: 08:00:00 via INTRAVENOUS

## 2016-11-06 MED ORDER — ZOLPIDEM TARTRATE 5 MG PO TABS: 5.0000 mg | ORAL_TABLET | Freq: Every evening | ORAL | Status: DC | PRN

## 2016-11-06 MED ORDER — DIPHENHYDRAMINE HCL 25 MG PO CAPS: 25.0000 mg | ORAL_CAPSULE | Freq: Four times a day (QID) | ORAL | Status: DC | PRN

## 2016-11-06 MED ORDER — TERBUTALINE SULFATE 1 MG/ML IJ SOLN
0.2500 mg | Freq: Once | INTRAMUSCULAR | Status: DC | PRN
  Filled 2016-11-06: qty 1

## 2016-11-06 MED ORDER — OXYTOCIN 40 UNITS IN LACTATED RINGERS INFUSION - SIMPLE MED
1.0000 m[IU]/min | INTRAVENOUS | Status: DC
  Administered 2016-11-06: 2 m[IU]/min via INTRAVENOUS
  Filled 2016-11-06: qty 1000

## 2016-11-06 MED ORDER — CEFAZOLIN IN D5W 1 GM/50ML IV SOLN
1.0000 g | Freq: Three times a day (TID) | INTRAVENOUS | Status: DC
  Filled 2016-11-06 (×2): qty 50

## 2016-11-06 MED ORDER — SOD CITRATE-CITRIC ACID 500-334 MG/5ML PO SOLN: 30.0000 mL | ORAL | Status: DC | PRN

## 2016-11-06 MED ORDER — LACTATED RINGERS IV SOLN: 500.0000 mL | INTRAVENOUS | Status: DC | PRN

## 2016-11-06 MED ORDER — PHENYLEPHRINE 40 MCG/ML (10ML) SYRINGE FOR IV PUSH (FOR BLOOD PRESSURE SUPPORT)
80.0000 ug | PREFILLED_SYRINGE | INTRAVENOUS | Status: DC | PRN
  Filled 2016-11-06: qty 5
  Filled 2016-11-06: qty 10

## 2016-11-06 MED ORDER — ONDANSETRON HCL 4 MG/2ML IJ SOLN: 4.0000 mg | INTRAMUSCULAR | Status: DC | PRN

## 2016-11-06 MED ORDER — OXYTOCIN 40 UNITS IN LACTATED RINGERS INFUSION - SIMPLE MED
2.5000 [IU]/h | INTRAVENOUS | Status: DC
  Administered 2016-11-06: 2.5 [IU]/h via INTRAVENOUS

## 2016-11-06 MED ORDER — OXYCODONE-ACETAMINOPHEN 5-325 MG PO TABS: 1.0000 | ORAL_TABLET | ORAL | Status: DC | PRN

## 2016-11-06 MED ORDER — FENTANYL CITRATE (PF) 100 MCG/2ML IJ SOLN: 50.0000 ug | INTRAMUSCULAR | Status: DC | PRN

## 2016-11-06 MED ORDER — LIDOCAINE HCL (PF) 1 % IJ SOLN
30.0000 mL | INTRAMUSCULAR | Status: DC | PRN
  Filled 2016-11-06: qty 30

## 2016-11-06 MED ORDER — LIDOCAINE HCL (PF) 1 % IJ SOLN
INTRAMUSCULAR | Status: DC | PRN
  Administered 2016-11-06: 6 mL via EPIDURAL
  Administered 2016-11-06: 7 mL via EPIDURAL

## 2016-11-06 MED ORDER — METHYLERGONOVINE MALEATE 0.2 MG PO TABS: 0.2000 mg | ORAL_TABLET | ORAL | Status: DC | PRN

## 2016-11-06 MED ORDER — SENNOSIDES-DOCUSATE SODIUM 8.6-50 MG PO TABS
2.0000 | ORAL_TABLET | ORAL | Status: DC
  Administered 2016-11-06 – 2016-11-08 (×2): 2 via ORAL
  Filled 2016-11-06 (×2): qty 2

## 2016-11-06 MED ORDER — PHENYLEPHRINE 40 MCG/ML (10ML) SYRINGE FOR IV PUSH (FOR BLOOD PRESSURE SUPPORT)
80.0000 ug | PREFILLED_SYRINGE | INTRAVENOUS | Status: DC | PRN
  Filled 2016-11-06: qty 5

## 2016-11-06 MED ORDER — FENTANYL 2.5 MCG/ML BUPIVACAINE 1/10 % EPIDURAL INFUSION (WH - ANES)
14.0000 mL/h | INTRAMUSCULAR | Status: DC | PRN
  Administered 2016-11-06: 14 mL/h via EPIDURAL
  Filled 2016-11-06: qty 100

## 2016-11-06 MED ORDER — METHYLERGONOVINE MALEATE 0.2 MG/ML IJ SOLN: 0.2000 mg | INTRAMUSCULAR | Status: DC | PRN

## 2016-11-06 MED ORDER — DIBUCAINE 1 % RE OINT: 1.0000 "application " | TOPICAL_OINTMENT | RECTAL | Status: DC | PRN

## 2016-11-06 MED ORDER — IBUPROFEN 600 MG PO TABS
600.0000 mg | ORAL_TABLET | Freq: Four times a day (QID) | ORAL | Status: DC
  Administered 2016-11-06 – 2016-11-08 (×8): 600 mg via ORAL
  Filled 2016-11-06 (×8): qty 1

## 2016-11-06 MED ORDER — ONDANSETRON HCL 4 MG PO TABS: 4.0000 mg | ORAL_TABLET | ORAL | Status: DC | PRN

## 2016-11-06 MED ORDER — COCONUT OIL OIL
1.0000 "application " | TOPICAL_OIL | Status: DC | PRN
  Administered 2016-11-07: 1 via TOPICAL
  Filled 2016-11-06: qty 120

## 2016-11-06 MED ORDER — WITCH HAZEL-GLYCERIN EX PADS: 1.0000 "application " | MEDICATED_PAD | CUTANEOUS | Status: DC | PRN

## 2016-11-06 MED ORDER — LACTATED RINGERS IV SOLN
500.0000 mL | Freq: Once | INTRAVENOUS | Status: AC
  Administered 2016-11-06: 500 mL via INTRAVENOUS

## 2016-11-06 MED ORDER — DIPHENHYDRAMINE HCL 50 MG/ML IJ SOLN: 12.5000 mg | INTRAMUSCULAR | Status: DC | PRN

## 2016-11-06 MED ORDER — VANCOMYCIN HCL IN DEXTROSE 1-5 GM/200ML-% IV SOLN: 1000.0000 mg | Freq: Two times a day (BID) | INTRAVENOUS | Status: DC

## 2016-11-06 MED ORDER — BENZOCAINE-MENTHOL 20-0.5 % EX AERO
1.0000 "application " | INHALATION_SPRAY | CUTANEOUS | Status: DC | PRN
  Administered 2016-11-06: 1 via TOPICAL
  Filled 2016-11-06: qty 56

## 2016-11-06 MED ORDER — ONDANSETRON HCL 4 MG/2ML IJ SOLN: 4.0000 mg | Freq: Four times a day (QID) | INTRAMUSCULAR | Status: DC | PRN

## 2016-11-06 MED ORDER — ACETAMINOPHEN 325 MG PO TABS: 650.0000 mg | ORAL_TABLET | ORAL | Status: DC | PRN

## 2016-11-06 MED ORDER — OXYTOCIN BOLUS FROM INFUSION: 500.0000 mL | Freq: Once | INTRAVENOUS | Status: DC

## 2016-11-06 MED ORDER — OXYCODONE-ACETAMINOPHEN 5-325 MG PO TABS: 2.0000 | ORAL_TABLET | ORAL | Status: DC | PRN

## 2016-11-06 NOTE — Anesthesia Procedure Notes (Signed)
Epidural Patient location during procedure: OB Start time: 11/06/2016 11:59 AM End time: 11/06/2016 12:02 PM  Staffing Anesthesiologist: Leilani Able Performed: anesthesiologist   Preanesthetic Checklist Completed: patient identified, surgical consent, pre-op evaluation, timeout performed, IV checked, risks and benefits discussed and monitors and equipment checked  Epidural Patient position: sitting Prep: site prepped and draped and DuraPrep Patient monitoring: continuous pulse ox and blood pressure Approach: midline Location: L3-L4 Injection technique: LOR air  Needle:  Needle type: Tuohy  Needle gauge: 17 G Needle length: 9 cm and 9 Needle insertion depth: 5 cm cm Catheter type: closed end flexible Catheter size: 19 Gauge Catheter at skin depth: 10 cm Test dose: negative and Other  Assessment Sensory level: T9 Events: blood not aspirated, injection not painful, no injection resistance, negative IV test and no paresthesia  Additional Notes Reason for block:procedure for pain

## 2016-11-06 NOTE — Anesthesia Preprocedure Evaluation (Signed)
Anesthesia Evaluation  Patient identified by MRN, date of birth, ID band Patient awake    Reviewed: Allergy & Precautions, H&P , NPO status , Patient's Chart, lab work & pertinent test results  Airway Mallampati: II  TM Distance: >3 FB Neck ROM: full    Dental no notable dental hx.    Pulmonary neg pulmonary ROS,    Pulmonary exam normal        Cardiovascular Normal cardiovascular exam     Neuro/Psych negative neurological ROS     GI/Hepatic negative GI ROS, Neg liver ROS,   Endo/Other  negative endocrine ROS  Renal/GU negative Renal ROS     Musculoskeletal   Abdominal (+) + obese,   Peds  Hematology negative hematology ROS (+)   Anesthesia Other Findings   Reproductive/Obstetrics (+) Pregnancy                             Anesthesia Physical Anesthesia Plan  ASA: II  Anesthesia Plan: Epidural   Post-op Pain Management:    Induction:   Airway Management Planned:   Additional Equipment:   Intra-op Plan:   Post-operative Plan:   Informed Consent: I have reviewed the patients History and Physical, chart, labs and discussed the procedure including the risks, benefits and alternatives for the proposed anesthesia with the patient or authorized representative who has indicated his/her understanding and acceptance.     Plan Discussed with:   Anesthesia Plan Comments:         Anesthesia Quick Evaluation

## 2016-11-06 NOTE — H&P (Signed)
[name] is a 31 y.o. female at 40 wks and 3 days  presenting for Induction of labor due to post-term. Pregnancy has been uncomplicated. Prenatal care is provided by [name].   . OB History    Gravida Para Term Preterm AB Living   SAB TAB Ectopic Multiple Live Births           1     Past Medical History:  Diagnosis Date  . Anxiety   . Condyloma   . Conversion disorder   . Dysrhythmia   . H/O psychosis   . Hx of varicella   . Wolff-Parkinson-White syndrome    Past Surgical History:  Procedure Laterality Date  . heart ablation    . SHOULDER SURGERY    . TONSILLECTOMY    . WISDOM TOOTH EXTRACTION     Family History: family history includes Anxiety disorder in her paternal aunt; Asthma in her mother; Cancer in her maternal grandfather; Depression in her other; Diabetes in her maternal grandmother and mother; Heart disease in her maternal grandfather; Hypertension in her father and maternal grandmother; Rheum arthritis in her maternal grandmother; Stroke in her father, maternal grandmother, and mother. Social History:  reports that she has never smoked. She has never used smokeless tobacco. She reports that she drinks alcohol. She reports that she does not use drugs.     Maternal Diabetes: No Genetic Screening: Declined Maternal Ultrasounds/Referrals: Normal Fetal Ultrasounds or other Referrals:  None Maternal Substance Abuse:  No Significant Maternal Medications:  None Significant Maternal Lab Results:  Lab values include: Group B Strep positive Other Comments:  None  Review of Systems  Constitutional: Negative.   HENT: Negative.   Eyes: Negative.   Respiratory: Negative.   Cardiovascular: Negative.   Gastrointestinal: Negative.   Genitourinary: Negative.   Musculoskeletal: Negative.   Skin: Negative.   Neurological: Negative.   Endo/Heme/Allergies: Negative.   Psychiatric/Behavioral: Negative.    Maternal Medical History:   Contractions: Frequency: rare.    Fetal activity: Perceived fetal activity is normal.   Last perceived fetal movement was within the past hour.    Prenatal complications: no prenatal complications Prenatal Complications - Diabetes: none.    Dilation: 3 Effacement (%): 60 Station: -3 Exam by:: [name] [name] Blood pressure 100/64, pulse 88, height  (1.676 Kayla), weight 97.1 kg (214 lb), unknown if currently breastfeeding. Maternal Exam:  Abdomen: Patient reports no abdominal tenderness. Fetal presentation: vertex  Introitus: Normal vulva. Normal vagina.  Pelvis: adequate for delivery.      Fetal Exam Fetal Monitor Review: Baseline rate: 130.  Variability: moderate (6-25 bpm).   Pattern: accelerations present and no decelerations.    Fetal State Assessment: Category I - tracings are normal.     Physical Exam  Vitals reviewed. Constitutional: She is oriented to person, place, and time. She appears well-developed and well-nourished.  HENT:  Head: Normocephalic and atraumatic.  Eyes: Conjunctivae are normal. Pupils are equal, round, and reactive to light.  Neck: Normal range of motion. Neck supple.  Cardiovascular: Normal rate and regular rhythm.   Respiratory: Effort normal and breath sounds normal.  GI: There is no tenderness.  Genitourinary: Vagina normal.  Musculoskeletal: Normal range of motion.  Neurological: She is alert and oriented to person, place, and time. She has normal reflexes.  Skin: Skin is warm and dry.  Psychiatric: She has a normal mood and affect.    Prenatal labs:  ABO, Rh: --/--/AB POS, AB POS (04/04 0815) Antibody: NEG (04/04 0815) Rubella: Immune (07/27 0000) RPR: Nonreactive (07/27 0000)  HBsAg: Negative (07/27 0000)  HIV: Non-reactive (07/27 0000)  GBS: Positive (08/28 0000)   Assessment/Plan: 40 wks and 3 days for elective induction due to post term. Increased r/o cesarean section assoiciated with cesarean section discussed. Pt  accepts this risk  Plan pitocin for induction.  AROM with fetal descent.  Ancef for gbs prophylaxis given mild penicillin allergy  Anticipate SVD  H/O conversion disorder and psychosis- currently stable- plan social work consult post delivery   [name]. 11/06/2016, 1:33 PM

## 2016-11-06 NOTE — Lactation Note (Signed)
This note was copied from a baby's chart. Lactation Consultation Note  Patient Name: [name] Today's Date: 11/06/2016 Reason for consult: Initial assessment   Initial consult with Exp BF mom of < 1 hour old infant in [place]. Mom reports she exclusively BF her 31 yo for 6-8 weeks, She reports she had difficulty with milk supply and had to start formula at 8 weeks, she BF and formula fed after that for a few weeks.  Mom reports she used Fenugreek and was prescribed Reglan that she did not get filled. Discussed supply and demand and enc mom to massage/compress breasts with feeding. Enc mom to obtain frequent weight checks at [facility] office and at BF Support Groups and to monitor I/O.   Infant was latched and feeding when I entered the room. Enc mom to feed her STS 8-12 x in 24 hours at first feeding cues. Enc mom to massage/compress breast with feeding. Enc mom to hand express prior to latch and after latch. Mom showed she was able to hand express, glistening of colostrum noted to left breast, infant was latched to right breast. Mom asked for a manual pump to express milk to spoon feed infant if needed, enc her to use hand expression to obtain colostrum. Mom reports minimal breast changes with pregnancy, she reports her areola darkened.   BF Basics, pillow and head support, colostrum, milk coming to volume, hand expression, cluster feeding, infant stomach size and NB nutritional needs reviewed. Feeding log given with instructions for use.   BF Resources Handout and LC Brochure given, mom informed of IP/OP services, BF Support Groups and LC phone #. Enc mom to call out for feeding assistance as needed. Mom reports she has a Medela PIS at home and is planning to order a new pump from [employer]. Mom without further questions/concerns as needed.    Maternal Data Formula Feeding for Exclusion: No Has patient been taught Hand Expression?: Yes Does the patient have breastfeeding  experience prior to this delivery?: Yes  Feeding Feeding Type: Breast Fed Length of feed: 15 min (still feeding when LC left the room)  LATCH Score/Interventions Latch: Grasps breast easily, tongue down, lips flanged, rhythmical sucking.  Audible Swallowing: A few with stimulation Intervention(s): Skin to skin;Hand expression;Alternate breast massage  Type of Nipple: Everted at rest and after stimulation  Comfort (Breast/Nipple): Soft / non-tender     Hold (Positioning): No assistance needed to correctly position infant at breast.  LATCH Score: 9  Lactation Tools Discussed/Used WIC Program: No   Consult Status Consult Status: Follow-up Date: 11/07/16 Follow-up type: In-patient    [name] 11/06/2016, 4:44 PM

## 2016-11-06 NOTE — Anesthesia Pain Management Evaluation Note (Signed)
  CRNA Pain Management Visit Note  Patient: [name], 31 y.o., female  "Hello I am a member of the anesthesia team at [facility]. We have an anesthesia team available at all times to provide care throughout the hospital, including epidural management and anesthesia for C-section. I don't know your plan for the delivery whether it a natural birth, water birth, IV sedation, nitrous supplementation, doula or epidural, but we want to meet your pain goals."   1.Was your pain managed to your expectations on prior hospitalizations?   No   2.What is your expectation for pain management during this hospitalization?     Epidural  3.How can we help you reach that goal?" Maintain epidural, my left leg is more numb than my right. I am comfortable at this point in time."  Record the patient's initial score and the patient's pain goal.   Pain: 0  Pain Goal: 3 [facility] wants you to be able to say your pain was always managed very well.  Jameela Michna 11/06/2016

## 2016-11-06 NOTE — Progress Notes (Signed)
[name] is a 31 y.o. G2P1001 at [redacted]w[redacted]d  admitted for induction of labor due to Post dates. Due date 11/03/2016.  Subjective: Patient is comfortable with her epidural   Objective: BP 123/62   Pulse 90   Temp 98.3 F (36.8 C) (Oral)   Resp 18   Ht  (1.676 m)   Wt 97.1 kg (214 lb)   BMI 34.54 kg/m  No intake/output data recorded. Total I/O In: -  Out: 300 [Blood:300]  FHT:  FHR: 130 bpm, variability: moderate,  accelerations:  Present,  decelerations:  Absent UC:   regular, every 3 minutes SVE:  5/75/-2.Marland Kitchen AROM clear fluid  Labs: Lab Results  Component Value Date   WBC 7.9 11/06/2016   HGB 11.5 (L) 11/06/2016   HCT 34.0 (L) 11/06/2016   MCV 83.5 11/06/2016   PLT 220 11/06/2016    Assessment / Plan: Induction of labor due to gestational hypertension and postterm,  progressing well on pitocin  Labor: progressing normally on pitocin.. AROM performed Preeclampsia:  NA Fetal Wellbeing:  Category I Pain Control:  Epidural I/D:  Ancef Anticipated MOD:  NSVD  [name]. 11/06/2016, 5:41 PM

## 2016-11-07 LAB — CBC
HCT: 28.9 % — ABNORMAL LOW (ref 36.0–46.0)
Hemoglobin: 9.9 g/dL — ABNORMAL LOW (ref 12.0–15.0)
MCH: 29 pg (ref 26.0–34.0)
MCHC: 34.3 g/dL (ref 30.0–36.0)
MCV: 84.8 fL (ref 78.0–100.0)
Platelets: 195 10*3/uL (ref 150–400)
RBC: 3.41 MIL/uL — ABNORMAL LOW (ref 3.87–5.11)
RDW: 15 % (ref 11.5–15.5)
WBC: 11.1 10*3/uL — ABNORMAL HIGH (ref 4.0–10.5)

## 2016-11-07 LAB — RPR: RPR Ser Ql: NONREACTIVE

## 2016-11-07 LAB — ABO/RH: ABO/RH(D): AB POS

## 2016-11-07 MED ORDER — IBUPROFEN 600 MG PO TABS: 600.0000 mg | ORAL_TABLET | Freq: Four times a day (QID) | ORAL | 1 refills | Status: DC | PRN

## 2016-11-07 NOTE — Lactation Note (Signed)
This note was copied from a baby's chart. Lactation Consultation Note  Patient Name: [name] Today's Date: 11/07/2016 Reason for consult: Follow-up assessment  Baby is 20 hours old and has been to the breast several times.  LC reviewed doc flow sheets / WNL .  @ consult baby latched with depth/ right breast / football , multiple swallows noted.  Still feeding at 10 mins . Mom also hand expresses well.    Maternal Data Formula Feeding for Exclusion: No Has patient been taught Hand Expression?: Yes Does the patient have breastfeeding experience prior to this delivery?: Yes  Feeding Feeding Type: Breast Fed  LATCH Score/Interventions Latch: Grasps breast easily, tongue down, lips flanged, rhythmical sucking.  Audible Swallowing: A few with stimulation  Type of Nipple: Everted at rest and after stimulation  Comfort (Breast/Nipple): Soft / non-tender     Hold (Positioning): Assistance needed to correctly position infant at breast and maintain latch. Intervention(s): Breastfeeding basics reviewed;Support Pillows;Position options;Skin to skin  LATCH Score: 8  Lactation Tools Discussed/Used     Consult Status Consult Status: Follow-up Date: 11/08/16 Follow-up type: In-patient    Matilde Sprang Renella Steig 11/07/2016, 12:31 PM

## 2016-11-07 NOTE — Progress Notes (Signed)
CSW acknowledges consult for hx of conversion disorder and psychosis.  MOB's chart reviewed.  This CSW completed psychosocial assessment for same consult reason at MOB's first delivery in 2014.  MOB was open about her mental health hx stating that her dx was a result of life stressors and informed CSW that she was in treatment with a psychiatrist and therapist.   CSW reviewed current PNR, which notes no symptoms since 2012.  CSW does not feel this issue needs to be addressed again given there are no new concerns, but asks that staff contact CSW if current concerns arise, or by MOB's request.  CSW is screening out referral at this time. 

## 2016-11-07 NOTE — Progress Notes (Signed)
Post Partum Day 1 SVD Subjective: no complaints, up ad lib, voiding and tolerating PO  Objective: Blood pressure (!) 101/55, pulse 69, temperature 98.1 F (36.7 C), temperature source Oral, resp. rate 18, height  (1.676 m), weight 97.1 kg (214 lb), SpO2 100 %, unknown if currently breastfeeding.  Physical Exam:  General: alert and cooperative Lochia: appropriate Uterine Fundus: firm Incision: NA DVT Evaluation: No evidence of DVT seen on physical exam.   Recent Labs  11/06/16 0809 11/07/16 0533  HGB 11.5* 9.9*  HCT 34.0* 28.9*    Assessment/Plan: Plan for discharge tomorrow and Breastfeeding  Remote h/o conversion disorder/ psychosis-social work consult prior to discharge f/u in office in 2 wks for PPV Dr. Charlotta Newton covering 11/08/2016   LOS: 1 day   [name] J. 11/07/2016, 7:44 AM

## 2016-11-07 NOTE — Anesthesia Postprocedure Evaluation (Signed)
Anesthesia Post Note  Patient: [name]  Procedure(s) Performed: * No procedures listed *  Patient location during evaluation: Mother Baby Anesthesia Type: Epidural Level of consciousness: awake and alert Pain management: satisfactory to patient Vital Signs Assessment: post-procedure vital signs reviewed and stable Respiratory status: respiratory function stable Cardiovascular status: stable Postop Assessment: no headache, no backache, epidural receding, patient able to bend at knees, no signs of nausea or vomiting and adequate PO intake Anesthetic complications: no        Last Vitals:  Vitals:   11/06/16 2300 11/07/16 0650  BP: (!) 104/53 (!) 101/55  Pulse: 83 69  Resp: 16 18  Temp: 36.7 C 36.7 C    Last Pain:  Vitals:   11/07/16 0650  TempSrc: Oral  PainSc: 3    Pain Goal:                 [name]

## 2016-11-07 NOTE — Lactation Note (Signed)
This note was copied from a baby's chart. Lactation Consultation Note  Patient Name: [name] Today's Date: 11/07/2016 Reason for consult: Follow-up assessment;Breast/nipple pain;Hyperbilirubinemia Mom reports concern to [facility] that baby is not getting enough at breast and considering supplementing baby.  Cluster feeding reviewed with Mom but she reports baby is getting more fussy at breast. Mom has pumped and received few drops of colostrum, no colostrum present with hand expression at this visit but baby has been on breast at least 30 minutes. Mom c/o nipple pain, worse on right nipple, PS=10 with initial latch. Nipple has lip stick shape when baby comes off the breast. Initiated 24 nipple shield and Mom reports less discomfort. Scant amount of colostrum present in nipple shield. Baby Tcb at 26 hours was 9.8 - high risk zone. No serum done per RN who re-checked bili at this visit now at 10.8 at 30 hours of age. RN ordering serum bili for f/u.  Due to Mom's concern about baby being satisfied, lack of colostrum present with hand expression or pumping, increase bili - LC agreed with Mom to start supplementing with feedings. Plan discussed is for Mom to BF with each feeding - 15-20 minutes both breasts. Use 24 nipple shield to latch.  FOB to give supplement per hours of age guidelines. Mom to post pump for 15 minutes to encourage milk production and to have EBM to supplement when available. Mom to apply EBM/coconut oil to sore nipples.  Call for assist as needed with feedings.  Mom reports she is happy with plan.  Maternal Data    Feeding Feeding Type: Bottle Fed - Formula Nipple Type: Slow - flow Length of feed: 10 min  LATCH Score/Interventions Latch: Grasps breast easily, tongue down, lips flanged, rhythmical sucking. (initiated nipple shield due to nipple pain with latch)  Audible Swallowing: None  Type of Nipple: Everted at rest and after stimulation  Comfort  (Breast/Nipple): Filling, red/small blisters or bruises, mild/mod discomfort  Problem noted: Mild/Moderate discomfort Interventions (Mild/moderate discomfort):  (EBM to sore nipples, RN to bring Mom coconut oil)  Hold (Positioning): Assistance needed to correctly position infant at breast and maintain latch.  LATCH Score: 6  Lactation Tools Discussed/Used Tools: Nipple Dorris Carnes;Pump Nipple shield size: 24 Breast pump type: Double-Electric Breast Pump   Consult Status Consult Status: Follow-up Date: 11/08/16 Follow-up type: In-patient    Alfred Levins 11/07/2016, 10:32 PM

## 2016-11-08 NOTE — Progress Notes (Signed)
Post Partum Day 2 SVD Subjective: Resting comfortable in bed.  No F/C/CP/SOB.  Tolerating gen diet.  +flatus, +BMs.  Lochia appropriate.  No headache, no blurry vision, no RUQ pain.  No acute complaints  Objective: Blood pressure (!) 144/84, pulse (!) 108, temperature 98.6 F (37 C), temperature source Oral, resp. rate 18, height  (1.676 m), weight 214 lb (97.1 kg), SpO2 100 %, unknown if currently breastfeeding. Outlier: 144/84, BP range: 104-119/60s  Physical Exam:  General: alert and cooperative  CV: RRR Lungs: CTAB Abd: soft, non-tender Lochia: appropriate Uterine Fundus: firm, below umbilicus Incision: NA DVT Evaluation: No evidence of DVT seen on physical exam.   Recent Labs  11/06/16 0809 11/07/16 0533  HGB 11.5* 9.9*  HCT 34.0* 28.9*    Assessment/Plan: 31yo G2P2002 s/p NSVD, PPD#2 Remote h/o conversion disorder/ psychosis-seen by Child psychotherapist, plan for 2wk postpartum visit Meeting postpartum milestones appropriately, plan for discharge home today  Myna Hidalgo, DO [phone] (pager) [phone] (office)

## 2016-11-09 NOTE — Discharge Summary (Signed)
OB Discharge Summary     Patient Name: [name] DOB: [birth year] MRN: [id number]  Date of admission: 11/06/2016 Delivering MD: Gerald Leitz   Date of discharge: 11/08/2016  Admitting diagnosis: INDUCTION Intrauterine pregnancy: [redacted]w[redacted]d     Secondary diagnosis:  Active Problems:   Post term pregnancy   Vaginal delivery  Additional problems: symptomatic anemia     Discharge diagnosis: Term Pregnancy Delivered and Anemia                                                                                                Post partum procedures:blood transfusion  Augmentation: Pitocin  Complications: None  Hospital course:  Induction of Labor With Vaginal Delivery   31 y.o. yo G2P1001 at [redacted]w[redacted]d was admitted to the hospital 11/06/2016 for induction of labor.  Indication for induction: Postdates.  Patient had an uncomplicated labor course as follows: Membrane Rupture Time/Date: 1:46 PM ,11/06/2016   Intrapartum Procedures: Episiotomy: None [1]                                         Lacerations:  None [1]  Patient had delivery of a Viable infant.  Information for the patient's newborn:  Raynisha, Avilla [[id number]]  Delivery Method: Vaginal, Spontaneous Delivery (Filed from Delivery Summary)   11/06/2016  Details of delivery can be found in separate delivery note.  Patient had a routine postpartum course. Patient is discharged home 11/08/16.  Physical exam  Vitals:   11/06/16 2300 11/07/16 0650 11/07/16 1854 11/08/16 1030  BP: (!) 104/53 (!) 101/55 (!) 144/84 122/76  Pulse: 83 69 (!) 108 86  Resp: Temp: 98.1 F (36.7 C) 98.1 F (36.7 C) 98.6 F (37 C) 98.4 F (36.9 C)  TempSrc: Oral Oral Oral Oral  SpO2: 100%   100%  Weight:      Height:       General: alert, cooperative and no distress Lochia: appropriate Uterine Fundus: firm Incision: N/A DVT Evaluation: No evidence of DVT seen on physical exam. Labs: Lab Results  Component Value Date   WBC  11.1 (H) 11/07/2016   HGB 9.9 (L) 11/07/2016   HCT 28.9 (L) 11/07/2016   MCV 84.8 11/07/2016   PLT 195 11/07/2016   CMP Latest Ref Rng & Units 10/08/2007  Glucose 70 - 99 mg/dL 92  BUN 6 - 23 mg/dL 7  Creatinine 0.4 - 1.2 mg/dL 0.7  Sodium 782 - 956 meq/L 141  Potassium 3.5 - 5.1 meq/L 4.1  Chloride 96 - 112 meq/L 105  CO2 19 - 32 meq/L 30  Calcium 8.4 - 10.5 mg/dL 9.3    Discharge instruction: per After Visit Summary and "Baby and Me Booklet".  After visit meds:  Allergies as of 11/08/2016      Reactions   Fish Allergy Anaphylaxis   Shellfish Allergy    Augmentin [amoxicillin-pot Clavulanate] Hives, Other (See Comments)   infant reaction- can take zpaks   Orap [pimozide] Other (See Comments)  Lock jaw   Poultry Meal Diarrhea, Nausea And Vomiting      Medication List    TAKE these medications   acetaminophen 500 MG tablet Commonly known as:  TYLENOL Take 500 mg by mouth every 6 (six) hours as needed for moderate pain or headache.   ibuprofen 600 MG tablet Commonly known as:  ADVIL,MOTRIN Take 1 tablet (600 mg total) by mouth every 6 (six) hours as needed.   prenatal multivitamin Tabs tablet Take 1 tablet by mouth daily.       Diet: routine diet  Activity: Advance as tolerated. Pelvic rest for 6 weeks.   Outpatient follow up:2 weeks Follow up Appt:No future appointments. Follow up Visit:No Follow-up on file.  Postpartum contraception: Not Discussed  Newborn Data: Live born female  Birth Weight: 7 lb 1.4 oz (3215 g) APGAR: 8, 9  Baby Feeding: Breast Disposition:home with mother   11/09/2016 Myna Hidalgo, M, DO

## 2017-06-09 ENCOUNTER — Encounter (HOSPITAL_COMMUNITY): Payer: Self-pay

## 2019-05-05 ENCOUNTER — Other Ambulatory Visit: Payer: Self-pay

## 2019-05-05 DIAGNOSIS — I83899 Varicose veins of unspecified lower extremities with other complications: Secondary | ICD-10-CM

## 2019-05-07 ENCOUNTER — Telehealth (HOSPITAL_COMMUNITY): Payer: Self-pay

## 2019-05-07 NOTE — Telephone Encounter (Signed)
Contacted patient and confirmed appointment however patient was driving and unable to answer pre visit COVID screening questions.

## 2019-05-10 ENCOUNTER — Encounter: Payer: Self-pay | Admitting: Surgery

## 2019-05-10 ENCOUNTER — Ambulatory Visit: Payer: 59 | Admitting: Surgery

## 2019-05-10 ENCOUNTER — Ambulatory Visit (HOSPITAL_COMMUNITY)
Admission: RE | Admit: 2019-05-10 | Discharge: 2019-05-10 | Disposition: A | Discharge status: Home / Self Care | Payer: 59 | Source: Ambulatory Visit | Attending: Family | Admitting: Family

## 2019-05-10 ENCOUNTER — Other Ambulatory Visit: Payer: Self-pay

## 2019-05-10 VITALS — BP 116/80 | HR 70 | Temp 98.5°F | Resp 16 | Ht 66.0 in | Wt 209.0 lb

## 2019-05-10 DIAGNOSIS — I83899 Varicose veins of unspecified lower extremities with other complications: Secondary | ICD-10-CM

## 2019-05-10 NOTE — Progress Notes (Signed)
[facility]ascular and Vein Specialist of [facility]  Patient name: [name] MRN: [id number] DOB: 18-Oct-1985 Sex: female   REQUESTING PROVIDER:    Dr. Chales Salmon   REASON FOR CONSULT:    Painful varicose veins  HISTORY OF PRESENT ILLNESS:   [name] is a 33 y.o. female, who is referred today for evaluation of painful varicose veins.  She states that they have been present for several years since the birth of her last child.  The vein on the right is the one that bothers her the most.  This will get swollen and very hot at times.  The left varicosity does cause her some pain.  Her legs will hurt at night.  She does not have any significant swelling in her legs.  Her biggest complaint with leg pain is that which arises in her left buttocks and goes down the course the back of her leg down to her calf.  This is what bothers her the most.  She does stretching to try to improve her symptoms.  She denies any history of low back pain.  The patient has undergone ablation for Wolff-Parkinson-White she is a non-smoker  PAST MEDICAL HISTORY    Past Medical History:  Diagnosis Date   Anxiety    Condyloma    Conversion disorder    Dysrhythmia    H/O psychosis    Hx of varicella    Wolff-Parkinson-White syndrome      FAMILY HISTORY   Family History  Problem Relation Age of Onset   Hypertension Maternal Grandmother    Stroke Maternal Grandmother    Rheum arthritis Maternal Grandmother    Diabetes Maternal Grandmother    Heart disease Maternal Grandfather    Cancer Maternal Grandfather        prostate   Diabetes Mother        borderline   Asthma Mother    Stroke Mother    Hypertension Father    Stroke Father    Anxiety disorder Paternal Aunt    Depression Other     SOCIAL HISTORY:   Social History   Socioeconomic History   Marital status: Married    Spouse name: Not on file   Number of  children: Not on file   Years of education: Not on file   Highest education level: Not on file  Occupational History   Not on file  Social Needs   Financial resource strain: Not on file   Food insecurity    Worry: Not on file    Inability: Not on file   Transportation needs    Medical: Not on file    Non-medical: Not on file  Tobacco Use   Smoking status: Never Smoker   Smokeless tobacco: Never Used  Substance and Sexual Activity   Alcohol use: Yes    Comment: occas before pregnancy   Drug use: No   Sexual activity: Yes  Lifestyle   Physical activity    Days per week: Not on file    Minutes per session: Not on file   Stress: Not on file  Relationships   Social connections    Talks on phone: Not on file    Gets together: Not on file    Attends religious service: Not on file    Active member of club or organization: Not on file    Attends meetings of clubs or organizations: Not on file    Relationship status: Not on file   Intimate partner violence  Fear of current or ex partner: Not on file    Emotionally abused: Not on file    Physically abused: Not on file    Forced sexual activity: Not on file  Other Topics Concern   Not on file  Social History Narrative   Not on file    ALLERGIES:    Allergies  Allergen Reactions   Fish Allergy Anaphylaxis   Shellfish Allergy    Augmentin [Amoxicillin-Pot Clavulanate] Hives and Other (See Comments)    infant reaction- can take zpaks   Orap [Pimozide] Other (See Comments)    Lock jaw   Poultry Meal Diarrhea and Nausea And Vomiting    Can only eat Eggs.     CURRENT MEDICATIONS:    Current Outpatient Medications  Medication Sig Dispense Refill   loratadine (CLARITIN) 10 MG tablet Take 10 mg by mouth daily.     acetaminophen (TYLENOL) 500 MG tablet Take 500 mg by mouth every 6 (six) hours as needed for moderate pain or headache.     ibuprofen (ADVIL,MOTRIN) 600 MG tablet Take 1 tablet (600  mg total) by mouth every 6 (six) hours as needed. 30 tablet 1   Prenatal Vit-Fe Fumarate-FA (PRENATAL MULTIVITAMIN) TABS Take 1 tablet by mouth daily.     No current facility-administered medications for this visit.     REVIEW OF SYSTEMS:   [X]  denotes positive finding, [ ]  denotes negative finding Cardiac  Comments:  Chest pain or chest pressure:    Shortness of breath upon exertion:    Short of breath when lying flat:    Irregular heart rhythm:        Vascular    Pain in calf, thigh, or hip brought on by ambulation:    Pain in feet at night that wakes you up from your sleep:     Blood clot in your veins:    Leg swelling:         Pulmonary    Oxygen at home:    Productive cough:     Wheezing:         Neurologic    Sudden weakness in arms or legs:     Sudden numbness in arms or legs:     Sudden onset of difficulty speaking or slurred speech:    Temporary loss of vision in one eye:     Problems with dizziness:         Gastrointestinal    Blood in stool:      Vomited blood:         Genitourinary    Burning when urinating:     Blood in urine:        Psychiatric    Major depression:         Hematologic    Bleeding problems:    Problems with blood clotting too easily:        Skin    Rashes or ulcers:        Constitutional    Fever or chills:     PHYSICAL EXAM:   Vitals:   05/10/19 1347  BP: 116/80  Pulse: 70  Resp: 16  Temp: 98.5 F (36.9 C)  TempSrc: Oral  SpO2: 99%  Weight: 209 lb (94.8 kg)  Height: 5\' 6"  (1.676 m)    GENERAL: The patient is a well-nourished female, in no acute distress. The vital signs are documented above. CARDIAC: There is a regular rate and rhythm.  VASCULAR: Palpable pedal pulses.  Prominent varicosities around the  left anterior thigh and right medial knee.  No significant edema PULMONARY: Nonlabored respirations MUSCULOSKELETAL: There are no major deformities or cyanosis. NEUROLOGIC: No focal weakness or paresthesias are  detected. SKIN: There are no ulcers or rashes noted. PSYCHIATRIC: The patient has a normal affect.  STUDIES:   I have reviewed the following reflux study results: Venous Reflux Times Normal value < 0.5 sec +------------------------------+----------+---------+                                 Right (ms) Left (ms)  +------------------------------+----------+---------+  CFV                            3000.00    3117.00    +------------------------------+----------+---------+  FV                             543.00                +------------------------------+----------+---------+  Popliteal                      389.00                +------------------------------+----------+---------+  GSV at Saphenofemoral junction 3132.00    2758.00    +------------------------------+----------+---------+  GSV prox thigh                 3814.00    1885.00    +------------------------------+----------+---------+  GSV mid thigh                  3521.00    4152.00    +------------------------------+----------+---------+  GSV dist thigh                 5010.00    3785.00    +------------------------------+----------+---------+  GSV at knee                    4577.00    3557.00    +------------------------------+----------+---------+  +------------------------------+----------+---------+  VEIN DIAMETERS:                Right (cm) Left (cm)  +------------------------------+----------+---------+  GSV at Saphenofemoral junction 0.902      0.924      +------------------------------+----------+---------+  GSV at prox thigh              0.705      0.626      +------------------------------+----------+---------+  GSV at mid thigh               0.743      0.669      +------------------------------+----------+---------+  GSV at distal thigh            0.729      0.529      +------------------------------+----------+---------+  GSV at knee                    0.698      0.523       +------------------------------+----------+---------+  GSV prox calf                  0.374      0.414      +------------------------------+----------+---------+  GSV mid calf                   0.539      0.278      +------------------------------+----------+---------+  SSV origin                     0.361      0.248      +------------------------------+----------+---------+  SSV prox                       0.284      0.260      +------------------------------+----------+---------+  SSV mid                        0.279      0.237      +------------------------------+----------+---------+    Right Reflux Technical Findings: No evidence of DVT, SVT, or Baker's cyst. The sapheno-femoral junction is incompetent. The GSV demonstrates reflux from the sapheno-femoral junction into the calf. No evidence of SSV reflux. The common femoral vein demonstrates reflux. Left Reflux Technical Findings: No evidence of DVT, SVT, or Baker's cyst. The sapheno-femoral junction is incompetent. The GSV demonstrates reflux from the sapheno-femoral junction into the calf. No evidence of SSV reflux. The common femoral vein demonstrates reflux.   Summary: Right: No reflux was noted in the femoral vein in the thigh, popliteal vein, proximal small saphenous vein, and mid small saphenous vein. Abnormal reflux times were noted in the common femoral vein, great saphenous vein at the saphenofemoral junction,  great saphenous vein at the proximal thigh, great saphenous vein at the mid thigh, great saphenous vein at the distal thigh, and great saphenous vein at the knee. Evidence of chronic venous insufficiency is detected in the great saphenous vein, and deep  venous system. There is no evidence of deep vein thrombosis in the lower extremity. There is no evidence of superficial venous thrombosis. No cystic structure found in the popliteal fossa. Left: No reflux was noted in the femoral vein in the thigh, popliteal vein,  and proximal small saphenous vein. Abnormal reflux times were noted in the common femoral vein, great saphenous vein at the saphenofemoral junction, great saphenous vein at the  proximal thigh, great saphenous vein at the mid thigh, great saphenous vein at the distal thigh, and great saphenous vein at the knee. Evidence of chronic venous insufficiency is detected in the great saphenous vein, and deep venous system. There is no  evidence of deep vein thrombosis in the lower extremity. There is no evidence of superficial venous thrombosis. No cystic structure found in the popliteal fossa.     ASSESSMENT and PLAN   Painful varicosities: The patient has a varix on the right leg at the medial knee and on the left leg in the anterior thigh that are problematic for her.  They do cause her some discomfort at times but will become raised and warm which is very bothersome.  She is not having any leg swelling.  Her reflux study was positive for saphenous vein reflux bilaterally.  I discussed with the patient that since she is not having significant edema, she may benefit from treatment of just the varicosities or perhaps no treatment at all.  We discussed the possibility of stab phlebectomy versus sclerotherapy.  I am going to put her and 20-30 thigh-high compression stockings and have her come back to be further evaluated at the vein center in 3 months to make the definitive decision as to how to proceed.  Leg pain: The distribution that she describes of her left leg pain is in the sciatic nerve.  I have recommended that she speak with  her medical doctor about further work-up of this including evaluation of her lower back.   Charlena Cross, MD, FACS [facility] Specialists of [facility] [phone] Pager [phone]

## 2019-06-28 ENCOUNTER — Other Ambulatory Visit: Payer: Self-pay | Admitting: Obstetrics and Gynecology

## 2019-06-28 ENCOUNTER — Other Ambulatory Visit (HOSPITAL_COMMUNITY)
Admission: RE | Admit: 2019-06-28 | Discharge: 2019-06-28 | Disposition: A | Discharge status: Home / Self Care | Payer: 59 | Source: Ambulatory Visit | Attending: Obstetrics and Gynecology | Admitting: Obstetrics and Gynecology

## 2019-06-28 DIAGNOSIS — Z01419 Encounter for gynecological examination (general) (routine) without abnormal findings: Secondary | ICD-10-CM | POA: Insufficient documentation

## 2019-06-30 LAB — CYTOLOGY - PAP
Comment: NEGATIVE
High risk HPV: NEGATIVE

## 2019-08-25 ENCOUNTER — Ambulatory Visit: Payer: 59 | Admitting: Vascular Surgery

## 2020-02-23 ENCOUNTER — Ambulatory Visit: Payer: 59 | Admitting: Vascular Surgery

## 2020-03-01 ENCOUNTER — Other Ambulatory Visit: Payer: Self-pay

## 2020-03-01 ENCOUNTER — Encounter: Payer: Self-pay | Admitting: Vascular Surgery

## 2020-03-01 ENCOUNTER — Ambulatory Visit: Payer: 59 | Admitting: Vascular Surgery

## 2020-03-01 VITALS — BP 102/73 | HR 78 | Temp 98.3°F | Resp 18 | Ht 66.5 in | Wt 213.9 lb

## 2020-03-01 DIAGNOSIS — I83813 Varicose veins of bilateral lower extremities with pain: Secondary | ICD-10-CM

## 2020-03-01 NOTE — Progress Notes (Signed)
Patient name: [name] MRN: [id number] DOB: [birth year] Sex: female  HPI: [name] is a 34 y.o. female, who returns for follow-up today.  She was last seen by my partner Dr. Myra Gianotti October 2020 for symptomatic varicose veins with pain.  She states the varicose veins in her legs got worse after each pregnancy with her children.  She also has a family history of varicose veins.  She describes a burning and aching over a cluster of varicosities in her right medial knee and left anterior thigh.  She has worn compression stockings which did not relieve her symptoms and actually have exacerbated them as the compression stockings tend to irritate the prominent veins over her medial knee and medial anterior left thigh.  She has no prior history of DVT.  She has not had any prior vein procedures.  Her symptoms are exacerbated by standing on her feet all day long.  Other medical problems include prior cardiac ablation for WPW which has been stable.  Past Medical History:  Diagnosis Date  . Anxiety   . Condyloma   . Conversion disorder   . Dysrhythmia   . H/O psychosis   . Hx of varicella   . Wolff-Parkinson-White syndrome    Past Surgical History:  Procedure Laterality Date  . heart ablation    . SHOULDER SURGERY    . TONSILLECTOMY    . WISDOM TOOTH EXTRACTION      Family History  Problem Relation Age of Onset  . Hypertension Maternal Grandmother   . Stroke Maternal Grandmother   . Rheum arthritis Maternal Grandmother   . Diabetes Maternal Grandmother   . Heart disease Maternal Grandfather   . Cancer Maternal Grandfather        prostate  . Diabetes Mother        borderline  . Asthma Mother   . Stroke Mother   . Hypertension Father   . Stroke Father   . Anxiety disorder Paternal Aunt   . Depression Other     SOCIAL HISTORY: Social History   Socioeconomic History  . Marital status: Married    Spouse name: Not on file  . Number of children: Not on  file  . Years of education: Not on file  . Highest education level: Not on file  Occupational History  . Not on file  Tobacco Use  . Smoking status: Never Smoker  . Smokeless tobacco: Never Used  Substance and Sexual Activity  . Alcohol use: Yes    Comment: occas before pregnancy  . Drug use: No  . Sexual activity: Yes  Other Topics Concern  . Not on file  Social History Narrative  . Not on file   Social Determinants of Health   Financial Resource Strain:   . Difficulty of Paying Living Expenses:   Food Insecurity:   . Worried About Programme researcher, broadcasting/film/video in the Last Year:   . Barista in the Last Year:   Transportation Needs:   . Freight forwarder (Medical):   Marland Kitchen Lack of Transportation (Non-Medical):   Physical Activity:   . Days of Exercise per Week:   . Minutes of Exercise per Session:   Stress:   . Feeling of Stress :   Social Connections:   . Frequency of Communication with Friends and Family:   . Frequency of Social Gatherings with Friends and Family:   . Attends Religious Services:   . Active Member of Clubs or Organizations:   .  Attends Banker Meetings:   Marland Kitchen Marital Status:   Intimate Partner Violence:   . Fear of Current or Ex-Partner:   . Emotionally Abused:   Marland Kitchen Physically Abused:   . Sexually Abused:     Allergies  Allergen Reactions  . Fish Allergy Anaphylaxis  . Shellfish Allergy   . Augmentin [Amoxicillin-Pot Clavulanate] Hives and Other (See Comments)    infant reaction- can take zpaks  . Orap [Pimozide] Other (See Comments)    Lock jaw    Current Outpatient Medications  Medication Sig Dispense Refill  . acetaminophen (TYLENOL) 500 MG tablet Take 500 mg by mouth every 6 (six) hours as needed for moderate pain or headache.    . ibuprofen (ADVIL,MOTRIN) 600 MG tablet Take 1 tablet (600 mg total) by mouth every 6 (six) hours as needed. 30 tablet 1  . levocetirizine (XYZAL) 2.5 MG/5ML solution Take 5 mg by mouth every  evening.    . loratadine (CLARITIN) 10 MG tablet Take 10 mg by mouth daily.    . montelukast (SINGULAIR) 10 MG tablet Take 10 mg by mouth daily.    . Prenatal Vit-Fe Fumarate-FA (PRENATAL MULTIVITAMIN) TABS Take 1 tablet by mouth daily.     No current facility-administered medications for this visit.    ROS:   General:  No weight loss, Fever, chills  HEENT: No recent headaches, no nasal bleeding, no visual changes, no sore throat  Neurologic: No dizziness, blackouts, seizures. No recent symptoms of stroke or mini- stroke. No recent episodes of slurred speech, or temporary blindness.  Cardiac: No recent episodes of chest pain/pressure, no shortness of breath at rest.  No shortness of breath with exertion.  Denies history of atrial fibrillation or irregular heartbeat  Vascular: No history of rest pain in feet.  No history of claudication.  No history of non-healing ulcer, No history of DVT   Pulmonary: No home oxygen, no productive cough, no hemoptysis,  No asthma or wheezing  Musculoskeletal:  [ ]  Arthritis, [ ]  Low back pain,  [ ]  Joint pain  Hematologic:No history of hypercoagulable state.  No history of easy bleeding.  No history of anemia  Gastrointestinal: No hematochezia or melena,  No gastroesophageal reflux, no trouble swallowing  Urinary: [ ]  chronic Kidney disease, [ ]  on HD - [ ]  MWF or [ ]  TTHS, [ ]  Burning with urination, [ ]  Frequent urination, [ ]  Difficulty urinating;   Skin: No rashes  Psychological: No history of anxiety,  No history of depression   Physical Examination  Vitals:   03/01/20 1550  BP: 102/73  Pulse: 78  Resp: 18  Temp: 98.3 F (36.8 C)  TempSrc: Temporal  SpO2: 99%  Weight: (!) 213 lb 14.4 oz (97 kg)  Height: 5' 6.5" (1.689 m)    Body mass index is 34.01 kg/m.  General:  Alert and oriented, no acute distress HEENT: Normal Neck: No JVD Cardiac: Regular Rate and Rhythm Skin: No rash, prominent cluster of varicosities right medial  knee 4 to 5 mm diameter encompassing a surface area of about 7 cm.  Prominent varicosity left anterothigh extending over about 12 cm 4 to 5 mm diameter. Extremity Pulses:  2+ dorsalis pedis, posterior tibial pulses bilaterally Musculoskeletal: No deformity or edema  Neurologic: Upper and lower extremity motor 5/5 and symmetric  DATA:  I reviewed the patient's previous venous reflux exam from October which shows diffuse reflux in the greater saphenous vein with 5 to 7 mm vein diameter.  She also did have some reflux in the common femoral vein bilaterally.  I repeated portions of her exam today which confirmed this.  This was done with the SonoSite at the bedside.  ASSESSMENT: Symptomatic varicose veins which have failed conservative management with compression stockings.   PLAN: Laser ablation right leg greater saphenous with greater than 20 stab avulsions followed by left leg.  Risk benefits possible complications of procedure details were discussed the patient today include not limited to bleeding infection DVT risk.  She understands and agrees to proceed.   Fabienne Bruns, MD [facility] [place] Office: [phone] Pager: [phone]

## 2020-03-21 ENCOUNTER — Telehealth: Payer: Self-pay | Admitting: *Deleted

## 2020-03-21 NOTE — Telephone Encounter (Signed)
Called [employer] regarding insurance approval [facility])  of CPT codes [phone] units)  and 05397 (2 units ). [employer] auth # [id number].  Belvia states she has just found out she is [redacted] weeks pregnant and will defer the laser ablation and stab phlebectomy procedures until after she delivers in spring 2022.

## 2020-03-29 LAB — OB RESULTS CONSOLE RUBELLA ANTIBODY, IGM: Rubella: IMMUNE

## 2020-03-29 LAB — HEPATITIS C ANTIBODY: HCV Ab: NEGATIVE

## 2020-03-29 LAB — OB RESULTS CONSOLE HEPATITIS B SURFACE ANTIGEN: Hepatitis B Surface Ag: NEGATIVE

## 2020-03-29 LAB — OB RESULTS CONSOLE HIV ANTIBODY (ROUTINE TESTING): HIV: NONREACTIVE

## 2020-04-14 LAB — OB RESULTS CONSOLE GC/CHLAMYDIA
Chlamydia: NEGATIVE
Gonorrhea: NEGATIVE

## 2020-08-05 NOTE — L&D Delivery Note (Signed)
Delivery Note At 1:16 PM a viable female was delivered via Vaginal, Spontaneous (Presentation:   Occiput Posterior).  Nuchal cord x 1 reduced. APGAR: 9, 9; weight  Pending  .   Placenta status: Spontaneous, Intact.  Cord: 3 vessels with the following complications: None.  Cord pH: NA  Anesthesia: Epidural Episiotomy: None Lacerations: None Suture Repair: NA Est. Blood Loss (mL): 200  Mom to postpartum.  Baby to Couplet care / Skin to Skin.  Gerald Leitz 10/29/2020, 1:55 PM

## 2020-10-13 LAB — OB RESULTS CONSOLE GBS: GBS: NEGATIVE

## 2020-10-28 ENCOUNTER — Other Ambulatory Visit: Payer: Self-pay | Admitting: Obstetrics and Gynecology

## 2020-10-28 NOTE — H&P (Deleted)
  The note originally documented on this encounter has been moved the the encounter in which it belongs.  

## 2020-10-28 NOTE — H&P (Signed)
[name] is a 35 y.o. female G3P2002 at 39 weeks and 1 day  presenting for  Induction of labor due to term pregnancy with favorable cervix. Pregnancy has been uncomplicated other than AMA. Prenatal care provided by Dr. Gerald Leitz with [facility]. OB history [phone]  Both previous deliveries were vaginal  2014 and 2018.   Past Medical History:  Diagnosis Date  . Anxiety   . Condyloma   . Conversion disorder   . Dysrhythmia   . H/O psychosis   . Hx of varicella   . Wolff-Parkinson-White syndrome    Past Surgical History:  Procedure Laterality Date  . heart ablation    . SHOULDER SURGERY    . TONSILLECTOMY    . WISDOM TOOTH EXTRACTION     Family History: family history includes Anxiety disorder in her paternal aunt; Asthma in her mother; Cancer in her maternal grandfather; Depression in an other family member; Diabetes in her maternal grandmother and mother; Heart disease in her maternal grandfather; Hypertension in her father and maternal grandmother; Rheum arthritis in her maternal grandmother; Stroke in her father, maternal grandmother, and mother. Social History:  reports that she has never smoked. She has never used smokeless tobacco. She reports current alcohol use. She reports that she does not use drugs.     Maternal Diabetes: No Genetic Screening: Normal Maternal Ultrasounds/Referrals: Normal Fetal Ultrasounds or other Referrals:  None Maternal Substance Abuse:  No Significant Maternal Medications:  None Significant Maternal Lab Results:  Group B Strep negative Other Comments:  None  Review of Systems  Constitutional: Negative.   HENT: Negative.   Eyes: Negative.   Respiratory: Negative.   Cardiovascular: Negative.   Gastrointestinal: Negative.   Endocrine: Negative.   Genitourinary: Negative.   Musculoskeletal: Negative.   Skin: Negative.   Allergic/Immunologic: Negative.   Neurological: Negative.   Hematological: Negative.    Psychiatric/Behavioral: Negative.    History   unknown if currently breastfeeding. Maternal Exam:  Introitus: Normal vulva.   Physical Exam Vitals reviewed.  Constitutional:      Appearance: Normal appearance.  HENT:     Head: Normocephalic and atraumatic.     Mouth/Throat:     Mouth: Mucous membranes are moist.  Eyes:     Pupils: Pupils are equal, round, and reactive to light.  Cardiovascular:     Rate and Rhythm: Normal rate and regular rhythm.     Pulses: Normal pulses.     Heart sounds: Normal heart sounds.  Pulmonary:     Effort: Pulmonary effort is normal.     Breath sounds: Normal breath sounds.  Abdominal:     Tenderness: There is no abdominal tenderness.  Genitourinary:    General: Normal vulva.  Musculoskeletal:        General: Swelling present. Normal range of motion.     Cervical back: Normal range of motion and neck supple.  Skin:    General: Skin is warm and dry.  Neurological:     General: No focal deficit present.     Mental Status: She is alert and oriented to person, place, and time.  Psychiatric:        Mood and Affect: Mood normal.        Behavior: Behavior normal.   10/27/2020 Cervix 2/25/-2 posterior    Prenatal labs: ABO, Rh:  AB positive  Antibody:  Negative  Rubella:  Immune RPR:   Nonreactive HBsAg:   Negative  HIV:   Negative  GBS:  Negative 10/13/2020  Assessment/Plan: 39 weeks and 1 day for induction due to term pregnancy / GBS negative 10/13/2020 - cytotec for further cervical ripening.  -pain control - to be determined by patient - anticipate SVD    Gerald Leitz 10/28/2020, 3:20 PM

## 2020-10-29 ENCOUNTER — Inpatient Hospital Stay (HOSPITAL_COMMUNITY): Payer: 59 | Admitting: Anesthesiology

## 2020-10-29 ENCOUNTER — Inpatient Hospital Stay (HOSPITAL_COMMUNITY): Payer: 59

## 2020-10-29 ENCOUNTER — Inpatient Hospital Stay (HOSPITAL_COMMUNITY)
Admission: AD | Admit: 2020-10-29 | Discharge: 2020-10-31 | DRG: 807 | Disposition: A | Discharge status: Home / Self Care | Payer: 59 | Attending: Obstetrics and Gynecology | Admitting: Obstetrics and Gynecology

## 2020-10-29 ENCOUNTER — Other Ambulatory Visit: Payer: Self-pay

## 2020-10-29 ENCOUNTER — Encounter (HOSPITAL_COMMUNITY): Payer: Self-pay | Admitting: Obstetrics and Gynecology

## 2020-10-29 DIAGNOSIS — Z20822 Contact with and (suspected) exposure to covid-19: Secondary | ICD-10-CM | POA: Diagnosis present

## 2020-10-29 DIAGNOSIS — Z3490 Encounter for supervision of normal pregnancy, unspecified, unspecified trimester: Secondary | ICD-10-CM

## 2020-10-29 DIAGNOSIS — Z3A39 39 weeks gestation of pregnancy: Secondary | ICD-10-CM

## 2020-10-29 DIAGNOSIS — O26893 Other specified pregnancy related conditions, third trimester: Secondary | ICD-10-CM | POA: Diagnosis present

## 2020-10-29 LAB — CBC
HCT: 34.7 % — ABNORMAL LOW (ref 36.0–46.0)
Hemoglobin: 11.6 g/dL — ABNORMAL LOW (ref 12.0–15.0)
MCH: 28 pg (ref 26.0–34.0)
MCHC: 33.4 g/dL (ref 30.0–36.0)
MCV: 83.8 fL (ref 80.0–100.0)
Platelets: 235 10*3/uL (ref 150–400)
RBC: 4.14 MIL/uL (ref 3.87–5.11)
RDW: 14.5 % (ref 11.5–15.5)
WBC: 7.7 10*3/uL (ref 4.0–10.5)
nRBC: 0 % (ref 0.0–0.2)

## 2020-10-29 LAB — RPR: RPR Ser Ql: NONREACTIVE

## 2020-10-29 LAB — RESP PANEL BY RT-PCR (FLU A&B, COVID) ARPGX2
Influenza A by PCR: NEGATIVE
Influenza B by PCR: NEGATIVE
SARS Coronavirus 2 by RT PCR: NEGATIVE

## 2020-10-29 LAB — TYPE AND SCREEN
ABO/RH(D): AB POS
Antibody Screen: NEGATIVE

## 2020-10-29 MED ORDER — LORATADINE 10 MG PO TABS
10.0000 mg | ORAL_TABLET | Freq: Every day | ORAL | Status: DC
  Administered 2020-10-31: 10 mg via ORAL
  Filled 2020-10-29 (×2): qty 1

## 2020-10-29 MED ORDER — DIBUCAINE (PERIANAL) 1 % EX OINT: 1.0000 "application " | TOPICAL_OINTMENT | CUTANEOUS | Status: DC | PRN

## 2020-10-29 MED ORDER — ONDANSETRON HCL 4 MG PO TABS: 4.0000 mg | ORAL_TABLET | ORAL | Status: DC | PRN

## 2020-10-29 MED ORDER — FERROUS SULFATE 325 (65 FE) MG PO TABS
325.0000 mg | ORAL_TABLET | Freq: Two times a day (BID) | ORAL | Status: DC
  Administered 2020-10-29 – 2020-10-31 (×3): 325 mg via ORAL
  Filled 2020-10-29 (×4): qty 1

## 2020-10-29 MED ORDER — FENTANYL-BUPIVACAINE-NACL 0.5-0.125-0.9 MG/250ML-% EP SOLN
12.0000 mL/h | EPIDURAL | Status: DC | PRN
  Administered 2020-10-29: 12 mL/h via EPIDURAL
  Filled 2020-10-29: qty 250

## 2020-10-29 MED ORDER — WITCH HAZEL-GLYCERIN EX PADS: 1.0000 "application " | MEDICATED_PAD | CUTANEOUS | Status: DC | PRN

## 2020-10-29 MED ORDER — FENTANYL CITRATE (PF) 100 MCG/2ML IJ SOLN: 50.0000 ug | INTRAMUSCULAR | Status: DC | PRN

## 2020-10-29 MED ORDER — LIDOCAINE HCL (PF) 1 % IJ SOLN: 30.0000 mL | INTRAMUSCULAR | Status: DC | PRN

## 2020-10-29 MED ORDER — ZOLPIDEM TARTRATE 5 MG PO TABS: 5.0000 mg | ORAL_TABLET | Freq: Every evening | ORAL | Status: DC | PRN

## 2020-10-29 MED ORDER — DIPHENHYDRAMINE HCL 25 MG PO CAPS: 25.0000 mg | ORAL_CAPSULE | Freq: Four times a day (QID) | ORAL | Status: DC | PRN

## 2020-10-29 MED ORDER — TERBUTALINE SULFATE 1 MG/ML IJ SOLN: 0.2500 mg | Freq: Once | INTRAMUSCULAR | Status: DC | PRN

## 2020-10-29 MED ORDER — ACETAMINOPHEN 325 MG PO TABS: 650.0000 mg | ORAL_TABLET | ORAL | Status: DC | PRN

## 2020-10-29 MED ORDER — FENTANYL-BUPIVACAINE-NACL 0.5-0.125-0.9 MG/250ML-% EP SOLN: 12.0000 mL/h | EPIDURAL | Status: DC | PRN

## 2020-10-29 MED ORDER — LACTATED RINGERS IV SOLN: 500.0000 mL | INTRAVENOUS | Status: DC | PRN

## 2020-10-29 MED ORDER — MISOPROSTOL 25 MCG QUARTER TABLET
25.0000 ug | ORAL_TABLET | ORAL | Status: DC | PRN
  Administered 2020-10-29 (×2): 25 ug via VAGINAL
  Filled 2020-10-29 (×3): qty 1

## 2020-10-29 MED ORDER — ONDANSETRON HCL 4 MG/2ML IJ SOLN: 4.0000 mg | Freq: Four times a day (QID) | INTRAMUSCULAR | Status: DC | PRN

## 2020-10-29 MED ORDER — BENZOCAINE-MENTHOL 20-0.5 % EX AERO: 1.0000 "application " | INHALATION_SPRAY | CUTANEOUS | Status: DC | PRN

## 2020-10-29 MED ORDER — DIPHENHYDRAMINE HCL 50 MG/ML IJ SOLN: 12.5000 mg | INTRAMUSCULAR | Status: DC | PRN

## 2020-10-29 MED ORDER — EPHEDRINE 5 MG/ML INJ: 10.0000 mg | INTRAVENOUS | Status: DC | PRN

## 2020-10-29 MED ORDER — OXYCODONE-ACETAMINOPHEN 5-325 MG PO TABS
2.0000 | ORAL_TABLET | ORAL | Status: DC | PRN
Start: 2020-10-29 — End: 2020-10-29

## 2020-10-29 MED ORDER — LACTATED RINGERS IV SOLN
500.0000 mL | Freq: Once | INTRAVENOUS | Status: AC
  Administered 2020-10-29: 500 mL via INTRAVENOUS

## 2020-10-29 MED ORDER — COCONUT OIL OIL
1.0000 | TOPICAL_OIL | Status: DC | PRN
Start: 2020-10-29 — End: 2020-10-31

## 2020-10-29 MED ORDER — OXYTOCIN-SODIUM CHLORIDE 30-0.9 UT/500ML-% IV SOLN
2.5000 [IU]/h | INTRAVENOUS | Status: DC
  Filled 2020-10-29: qty 500

## 2020-10-29 MED ORDER — OXYCODONE-ACETAMINOPHEN 5-325 MG PO TABS: 1.0000 | ORAL_TABLET | ORAL | Status: DC | PRN

## 2020-10-29 MED ORDER — LACTATED RINGERS IV SOLN: INTRAVENOUS | Status: DC

## 2020-10-29 MED ORDER — PHENYLEPHRINE 40 MCG/ML (10ML) SYRINGE FOR IV PUSH (FOR BLOOD PRESSURE SUPPORT): 80.0000 ug | PREFILLED_SYRINGE | INTRAVENOUS | Status: DC | PRN

## 2020-10-29 MED ORDER — SODIUM BICARBONATE 8.4 % IV SOLN: INTRAVENOUS | Status: DC | PRN

## 2020-10-29 MED ORDER — MONTELUKAST SODIUM 10 MG PO TABS
10.0000 mg | ORAL_TABLET | Freq: Every day | ORAL | Status: DC
  Administered 2020-10-29 – 2020-10-30 (×2): 10 mg via ORAL
  Filled 2020-10-29 (×2): qty 1

## 2020-10-29 MED ORDER — OXYTOCIN BOLUS FROM INFUSION
333.0000 mL | Freq: Once | INTRAVENOUS | Status: AC
  Administered 2020-10-29: 333 mL via INTRAVENOUS

## 2020-10-29 MED ORDER — METHYLERGONOVINE MALEATE 0.2 MG PO TABS
0.2000 mg | ORAL_TABLET | ORAL | Status: DC | PRN
Start: 2020-10-29 — End: 2020-10-31

## 2020-10-29 MED ORDER — OXYTOCIN-SODIUM CHLORIDE 30-0.9 UT/500ML-% IV SOLN
1.0000 m[IU]/min | INTRAVENOUS | Status: DC
  Administered 2020-10-29: 2 m[IU]/min via INTRAVENOUS

## 2020-10-29 MED ORDER — SOD CITRATE-CITRIC ACID 500-334 MG/5ML PO SOLN: 30.0000 mL | ORAL | Status: DC | PRN

## 2020-10-29 MED ORDER — IBUPROFEN 600 MG PO TABS
600.0000 mg | ORAL_TABLET | Freq: Four times a day (QID) | ORAL | Status: DC
  Administered 2020-10-29 – 2020-10-31 (×8): 600 mg via ORAL
  Filled 2020-10-29 (×8): qty 1

## 2020-10-29 MED ORDER — SENNOSIDES-DOCUSATE SODIUM 8.6-50 MG PO TABS
2.0000 | ORAL_TABLET | Freq: Every day | ORAL | Status: DC
  Administered 2020-10-30 – 2020-10-31 (×2): 2 via ORAL
  Filled 2020-10-29 (×2): qty 2

## 2020-10-29 MED ORDER — MEDROXYPROGESTERONE ACETATE 150 MG/ML IM SUSP: 150.0000 mg | INTRAMUSCULAR | Status: DC | PRN

## 2020-10-29 MED ORDER — HYDROXYZINE HCL 50 MG PO TABS: 50.0000 mg | ORAL_TABLET | Freq: Four times a day (QID) | ORAL | Status: DC | PRN

## 2020-10-29 MED ORDER — LIDOCAINE-EPINEPHRINE (PF) 2 %-1:200000 IJ SOLN
INTRAMUSCULAR | Status: DC | PRN
  Administered 2020-10-29: 5 mL via EPIDURAL

## 2020-10-29 MED ORDER — SIMETHICONE 80 MG PO CHEW: 80.0000 mg | CHEWABLE_TABLET | ORAL | Status: DC | PRN

## 2020-10-29 MED ORDER — ONDANSETRON HCL 4 MG/2ML IJ SOLN: 4.0000 mg | INTRAMUSCULAR | Status: DC | PRN

## 2020-10-29 MED ORDER — METHYLERGONOVINE MALEATE 0.2 MG/ML IJ SOLN: 0.2000 mg | INTRAMUSCULAR | Status: DC | PRN

## 2020-10-29 MED ORDER — LACTATED RINGERS IV SOLN: 500.0000 mL | Freq: Once | INTRAVENOUS | Status: DC

## 2020-10-29 NOTE — Anesthesia Preprocedure Evaluation (Addendum)
Anesthesia Evaluation  Patient identified by MRN, date of birth, ID band Patient awake    Reviewed: Allergy & Precautions, Patient's Chart, lab work & pertinent test results  Airway Mallampati: II       Dental no notable dental hx.    Pulmonary    Pulmonary exam normal        Cardiovascular Normal cardiovascular exam+ dysrhythmias      Neuro/Psych PSYCHIATRIC DISORDERS Anxiety    GI/Hepatic   Endo/Other    Renal/GU      Musculoskeletal   Abdominal   Peds  Hematology   Anesthesia Other Findings   Reproductive/Obstetrics (+) Pregnancy                            Anesthesia Physical Anesthesia Plan  ASA: II  Anesthesia Plan: Epidural   Post-op Pain Management:    Induction:   PONV Risk Score and Plan: 0  Airway Management Planned: Natural Airway  Additional Equipment: None  Intra-op Plan:   Post-operative Plan:   Informed Consent: I have reviewed the patients History and Physical, chart, labs and discussed the procedure including the risks, benefits and alternatives for the proposed anesthesia with the patient or authorized representative who has indicated his/her understanding and acceptance.       Plan Discussed with:   Anesthesia Plan Comments: (Lab Results      Component                Value               Date                      WBC                      7.7                 10/29/2020                HGB                      11.6 (L)            10/29/2020                HCT                      34.7 (L)            10/29/2020                MCV                      83.8                10/29/2020                PLT                      235                 10/29/2020           )       Anesthesia Quick Evaluation

## 2020-10-29 NOTE — Anesthesia Postprocedure Evaluation (Signed)
Anesthesia Post Note  Patient: [name]  Procedure(s) Performed: AN AD HOC LABOR EPIDURAL     Patient location during evaluation: Mother Baby Anesthesia Type: Epidural Level of consciousness: awake and alert Pain management: pain level controlled Vital Signs Assessment: post-procedure vital signs reviewed and stable Respiratory status: spontaneous breathing, nonlabored ventilation and respiratory function stable Cardiovascular status: stable Postop Assessment: no headache, no backache and epidural receding Anesthetic complications: no   No complications documented.  Last Vitals:  Vitals:   10/29/20 1457 10/29/20 1613  BP: 110/66 (!) 109/59  Pulse: 66 82  Resp: 18 18  Temp: 36.8 C 37 C  SpO2: 100% 99%    Last Pain:  Vitals:   10/29/20 1812  TempSrc:   PainSc: 4    Pain Goal:                   Junious Silk

## 2020-10-29 NOTE — Progress Notes (Signed)
SVD baby boy, skin to skin to skin with mother

## 2020-10-29 NOTE — Progress Notes (Signed)
First push, MD at BD

## 2020-10-29 NOTE — Anesthesia Procedure Notes (Signed)
Epidural Patient location during procedure: OB Start time: 10/29/2020 11:03 AM End time: 10/29/2020 11:09 AM  Staffing Anesthesiologist: Shelton Silvas, MD Performed: anesthesiologist   Preanesthetic Checklist Completed: patient identified, IV checked, site marked, risks and benefits discussed, surgical consent, monitors and equipment checked, pre-op evaluation and timeout performed  Epidural Patient position: sitting Prep: DuraPrep Patient monitoring: heart rate, continuous pulse ox and blood pressure Approach: midline Location: L3-L4 Injection technique: LOR saline  Needle:  Needle type: Tuohy  Needle gauge: 17 G Needle length: 9 cm Catheter type: closed end flexible Catheter size: 20 Guage Test dose: negative and 1.5% lidocaine  Assessment Events: blood not aspirated, injection not painful, no injection resistance and no paresthesia  Additional Notes LOR @ 5  Patient identified. Risks/Benefits/Options discussed with patient including but not limited to bleeding, infection, nerve damage, paralysis, failed block, incomplete pain control, headache, blood pressure changes, nausea, vomiting, reactions to medications, itching and postpartum back pain. Confirmed with bedside nurse the patient's most recent platelet count. Confirmed with patient that they are not currently taking any anticoagulation, have any bleeding history or any family history of bleeding disorders. Patient expressed understanding and wished to proceed. All questions were answered. Sterile technique was used throughout the entire procedure. Please see nursing notes for vital signs. Test dose was given through epidural catheter and negative prior to continuing to dose epidural or start infusion. Warning signs of high block given to the patient including shortness of breath, tingling/numbness in hands, complete motor block, or any concerning symptoms with instructions to call for help. Patient was given instructions on  fall risk and not to get out of bed. All questions and concerns addressed with instructions to call with any issues or inadequate analgesia.    Reason for block:procedure for pain

## 2020-10-29 NOTE — Progress Notes (Signed)
   Subjective: Pt rates contractions as 5 out 10. No lof no vaginal bleeding +FM .   Objective: BP 111/71   Pulse 84   Temp (!) 97.5 F (36.4 C) (Oral)   Resp 18   Ht 5' 6.5" (1.689 m)   Wt 99.6 kg   LMP 01/19/2020   SpO2 100%   BMI 34.91 kg/m  No intake/output data recorded. No intake/output data recorded.  FHT:  FHR: 130 bpm, variability: moderate,  accelerations:  Present,  decelerations:  Absent UC:   irregular, every 2-4 minutes SVE:   Dilation: 4 Effacement (%): 70 Station: -2 Exam by:: [employer]: Lab Results  Component Value Date   WBC 7.7 10/29/2020   HGB 11.6 (L) 10/29/2020   HCT 34.7 (L) 10/29/2020   MCV 83.8 10/29/2020   PLT 235 10/29/2020    Assessment / Plan: induction of labor due to term... plan to start pitocin 2x2  Labor: start pitocin 2x2.. plan AROM with fetal descent  Preeclampsia:  NA Fetal Wellbeing:  Category I Pain Control:  Labor support without medications I/D:  n/a Anticipated MOD:  NSVD  Gerald Leitz 10/29/2020, 10:20 AM

## 2020-10-29 NOTE — Lactation Note (Signed)
This note was copied from a baby's chart. Lactation Consultation Note  Patient Name: [name] Date: 10/29/2020 Reason for consult: Follow-up assessment;Term Age:37 hours  Mom sitting in bed, baby resting on back in bassinet sucking on hands. Mom reports baby just fed ~15mins, is taking a break before offering opposite breast. Mom reports with a 3yo and 10yo at home, reports breastfed both with nipple pain x42yr, introduced formula ~34mo when returned to work. Mom states breastfeeding with this baby is going ok but baby will not latch deeply to breast. Reports baby nurses for ~49mins per feeding.  Mom agreed to put baby back to breast, mom latched shallowly to left breast football hold, c/o pain. LC broke latch and assisted with latching deeper using teacup hold. Breast tissue movement, wider angle and audible swallows noted, mom denies breast pain but notes uterine cramping. Baby released breast on own ~32min mark, nipple slightly compressed at base on release.   Advised cue based feedings, expect 8-12 feedings q24hrs, wake if >3hrs since last feeding, skin to skin, avoid pacifier use. Discussed long term Xyzal use may decrease milk supply, watch for drowsiness in baby. Discussed pumping if notes decrease in supply. Mom voiced understanding and with no further concerns. Left the room with parents changing stool diaper. Informed RN that pt requests heating pad d/t uterine cramping and to use teacup hold when assisting with latch. BGilliam, RN, IBCLC  Maternal Data Has patient been taught Hand Expression?: No  Feeding Mother's Current Feeding Choice: Breast Milk  LATCH Score Latch: Grasps breast easily, tongue down, lips flanged, rhythmical sucking.  Audible Swallowing: A few with stimulation  Type of Nipple: Everted at rest and after stimulation  Comfort (Breast/Nipple): Soft / non-tender  Hold (Positioning): Assistance needed to correctly position infant at breast and  maintain latch.  LATCH Score: 8   Lactation Tools Discussed/Used    Interventions Interventions: Breast feeding basics reviewed;Assisted with latch;Skin to skin  Discharge Pump: Personal (Spectra at home) [facility]: No  Consult Status Consult Status: Follow-up Date: 10/30/20 Follow-up type: In-patient    [name] 10/29/2020, 10:08 PM

## 2020-10-29 NOTE — Transfer of Care (Signed)
Immediate Anesthesia Transfer of Care Note  Patient: [name]  Procedure(s) Performed: AN AD HOC LABOR EPIDURAL  Patient Location: PACU  Anesthesia Type:Epidural  Level of Consciousness: awake, alert  and oriented  Airway & Oxygen Therapy: Patient Spontanous Breathing  Post-op Assessment: Report given to RN and Post -op Vital signs reviewed and stable  Post vital signs: Reviewed and stable  Last Vitals:  Vitals Value Taken Time  BP    Temp    Pulse    Resp    SpO2      Last Pain:  Vitals:   10/29/20 1812  TempSrc:   PainSc: 4          Complications: No complications documented.

## 2020-10-29 NOTE — Lactation Note (Signed)
This note was copied from a baby's chart. Lactation Consultation Note  Patient Name: [name] Today's Date: 10/29/2020 Reason for consult: L&D Initial assessment Age:37 hours  L&D consult with 64 minutes old infant and P3 mother. Parents and aunt are present at time of consult. Congratulated them on their newborn. Assisted with latch. Infant sucks his tongue. Unsuccessfull attempts to latch, encouraged lots of skin to skin. Discussed STS as ideal transition for infants after birth helping with temperature, blood sugar and comfort. Talked about primal reflexes such as rooting, hands to mouth, searching for the breast among others.   No hand expression assistance at this time. Explained LC services availability during postpartum stay. Thanked family for their time.   Maternal Data Has patient been taught Hand Expression?: No Does the patient have breastfeeding experience prior to this delivery?: Yes How long did the patient breastfeed?: 12 months x2  Feeding Mother's Current Feeding Choice: Breast Milk  LATCH Score Latch: Repeated attempts needed to sustain latch, nipple held in mouth throughout feeding, stimulation needed to elicit sucking reflex.  Audible Swallowing: None  Type of Nipple: Everted at rest and after stimulation  Comfort (Breast/Nipple): Soft / non-tender  Hold (Positioning): Assistance needed to correctly position infant at breast and maintain latch.  LATCH Score: 6   Lactation Tools Discussed/Used    Interventions Interventions: Breast feeding basics reviewed;Assisted with latch;Skin to skin;Education  Discharge    Consult Status Consult Status: Follow-up Date: 10/29/20 Follow-up type: In-patient    Linels A Higuera Ancidey 10/29/2020, 2:15 PM

## 2020-10-30 LAB — CBC
HCT: 31.5 % — ABNORMAL LOW (ref 36.0–46.0)
Hemoglobin: 10.3 g/dL — ABNORMAL LOW (ref 12.0–15.0)
MCH: 28.1 pg (ref 26.0–34.0)
MCHC: 32.7 g/dL (ref 30.0–36.0)
MCV: 85.8 fL (ref 80.0–100.0)
Platelets: 203 10*3/uL (ref 150–400)
RBC: 3.67 MIL/uL — ABNORMAL LOW (ref 3.87–5.11)
RDW: 14.4 % (ref 11.5–15.5)
WBC: 10.8 10*3/uL — ABNORMAL HIGH (ref 4.0–10.5)
nRBC: 0 % (ref 0.0–0.2)

## 2020-10-30 NOTE — Social Work (Signed)
CSW attempted to meet with MOB to complete assessment. MOB in process of being prepped for procedure. CSW will follow-up prior to discharge.   Manfred Arch, MSW, [employer] Clinical Social Work [employer] and [employer] [phone]

## 2020-10-30 NOTE — Clinical Social Work Maternal (Signed)
CLINICAL SOCIAL WORK MATERNAL/CHILD NOTE  Patient Details  Name: [name] MRN: [id number] Date of Birth: [birth year]  Date:  10/30/2020  Clinical Social Worker Initiating Note:  Darra Lis, MSW, [place] Date/Time: Initiated:  10/30/20/1145     Child's Name:  [name]   Biological Parents:  Mother,Father ([name])   Need for Interpreter:  None   Reason for Referral:  Behavioral Health Concerns   Address:  [place] [address]    Phone number:  [phone] (home)     Additional phone number:   Household Members/Support Persons (HM/SP):   Household Member/Support Person 1,Household Member/Support Person 2,Household Member/Support Person 3   HM/SP Name Relationship DOB or Age  HM/SP -1 [name] Spouse 01/25/1985  HM/SP -2 Nylah [name] Daughter 02/23/2013  HM/SP -3 Zoey [name] Daughter 11/06/2016  HM/SP -4        HM/SP -5        HM/SP -6        HM/SP -7        HM/SP -8          Natural Supports (not living in the home):  [employer] Family   Professional Supports: Designer, industrial/product (Comment) Heritage manager)   Employment: Full-time   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   Education:  Production designer, theatre/television/film   Homebound arranged:    Museum/gallery curator Resources:  Multimedia programmer   Other Resources:      Cultural/Religious Considerations Which May Impact Care:    Strengths:  Ability to meet basic needs ,Pediatrician chosen,Home prepared for child ,Understanding of illness   Psychotropic Medications:         Pediatrician:    [place] area  Pediatrician List:   [place] Other ([facility])  [facility]      Pediatrician Fax Number:    Risk Factors/Current Problems:  None   Cognitive State:  Goal Oriented ,Insightful ,Alert ,Linear Thinking    Mood/Affect:  Interested ,Calm     CSW Assessment: CSW consulted for hx conversion disorder, anxiety, and psychosis. CSW reviewed chart and notes a diagnosis of bipolar. CSW met with MOB to complete assessment and offer support. CSW observed infant in bassinet having hearing test performed. CSW offered to return at a later time, but MOB declined. CSW introduced self and role. MOB was open and engaged throughout assessment. CSW informed MOB of the reason for consult. MOB reported she was diagnosed with conversion disorder in 2012 or 2013. MOB stated within the last two years she was diagnosed with bipolar depression. MOB expressed she was surprised with diagnosis considering she experiences more anxiety than depressive symptoms. MOB disclosed she was on Latuda 42m, 1x a night prior to pregnancy and stopped at 6 months due to not wanting it to impact infant. MOB stated her [facility] which she has been going to since October 2021. MOB reported she is currently unsure of if she will restart the medication. MOB disclosed she has also attended therapy with JWaymon Budgesince September 2018. MOB reported she attends therapy once every 2 to 3 months. MOB has an appointment with the psychiatrist on April 21 and an appointment with the counselor on April 14. MOB stated when her stress level high, it causes moments of "spiritual elements." MOB stated it does not involve any AH, VH  or a change of personality. MOB reported it was recommended by the counselor to see a spiritual counselor to address the reaction. MOB shared she also a physical reaction which includes shaking and body tics. MOB stated she has not has an episode of personality change since 2013. MOB stated in order to cope with the diagnosis she practices self-care, walking, prayer and attending church. MOB denies experiencing any mental health symptoms during pregnancy, stating she is always cautious and aware. MOB stated she has a strong  support system which consists of FOB, her mother and sister. MOB denies any current SI, HI or being involved in DV.  MOB appeared appropriate and did not display any acute mental health symptoms.  MOB resides with FOB and their two children. MOB works full-time and currently does not receive WIC benefits. MOB reported she may apply to see if she will qualify, following the birth of infant.  CSW provided education regarding the baby blues period versus PPD. CSW provided the New Mom Checklist and encouraged MOB to self evaluate and contact a medical professional if symptoms are noted at any time.  CSW provided review of Sudden Infant Death Syndrome (SIDS) precautions. MOB reported she has everything needed for infant. MOB has identified a pediatrician and denies any barriers to care. MOB denied having any additional needs at this time.  CSW identifies no further need for intervention and no barriers to discharge at this time.  CSW Plan/Description:  No Further Intervention Required/No Barriers to Discharge,Sudden Infant Death Syndrome (SIDS) Education,Perinatal Mood and Anxiety Disorder (PMADs) Education,Other Patient/Family Education,Other Information/Referral to [employer], [place] 10/30/2020, 1:15 PM

## 2020-10-30 NOTE — Progress Notes (Signed)
Post Partum Day 1 SVD  Subjective: no complaints, up ad lib, voiding, tolerating PO and + flatus  Objective: Blood pressure (!) 104/57, pulse 65, temperature 98 F (36.7 C), temperature source Oral, resp. rate 19, height 5' 6.5" (1.689 m), weight 99.6 kg, last menstrual period 01/19/2020, SpO2 100 %, unknown if currently breastfeeding.  Physical Exam:  General: alert, cooperative and no distress Lochia: appropriate Uterine Fundus: firm Incision: NA DVT Evaluation: No evidence of DVT seen on physical exam.  Recent Labs    10/29/20 0052 10/30/20 0348  HGB 11.6* 10.3*  HCT 34.7* 31.5*    Assessment/Plan: Plan for discharge tomorrow and Circumcision prior to discharge  Routine postpartum care    LOS: 1 day   [name] 10/30/2020, 9:07 AM

## 2020-10-31 MED ORDER — IBUPROFEN 600 MG PO TABS: 600.0000 mg | ORAL_TABLET | Freq: Four times a day (QID) | ORAL | 0 refills | Status: AC | PRN

## 2020-10-31 NOTE — Lactation Note (Signed)
This note was copied from a baby's chart. Lactation Consultation Note  Patient Name: [name] [id number] Date: 10/31/2020 Reason for consult: Follow-up assessment;Infant weight loss;Other (Comment);Term (6 % weight loss, post circ , and sleeping at present , not showing feeding cues. per mom baby had cluster fed 3-5 am, and she has milk in the refrigerator she pumped off. per mom milk feels like its coming in.) Age:37 hours  Per mom baby seems to be figuring it out for feedings.  Mom aware the baby needs to feed at least 8 times a day.  Mom mentioned she was going to pump again , breast are filling.  LC recommended breast feeding 1st then pump.  LC provided the [facility] resource brochure with resource numbers.   Maternal Data    Feeding Mother's Current Feeding Choice: Breast Milk  LATCH Score                    Lactation Tools Discussed/Used Tools: Pump Breast pump type: Double-Electric Breast Pump Pump Education: Milk Storage  Interventions Interventions: Breast feeding basics reviewed;DEBP;Education  Discharge Discharge Education: Warning signs for feeding baby;Engorgement and breast care Pump: Personal;DEBP;Manual  Consult Status Consult Status: Complete Date: 10/31/20    [name] 10/31/2020, 10:10 AM

## 2020-10-31 NOTE — Discharge Instructions (Signed)

## 2020-10-31 NOTE — Discharge Summary (Signed)
Postpartum Discharge Summary  Date of Service updated 10/31/2020     Patient Name: [name] DOB: 09/01/1985 MRN: [id number]  Date of admission: 10/29/2020 Delivery date:10/29/2020  Delivering provider: Christophe Louis  Date of discharge: 10/31/2020  Admitting diagnosis: Term pregnancy [Z34.90] Intrauterine pregnancy: [redacted]w[redacted]d    Secondary diagnosis:  Active Problems:   Term pregnancy  Additional problems: None    Discharge diagnosis: Term Pregnancy Delivered                                              Post partum procedures:None Augmentation: AROM, Pitocin and Cytotec Complications: None  Hospital course: Induction of Labor With Vaginal Delivery   35y.o. yo [id number] at 45w4das admitted to the hospital 10/29/2020 for induction of labor.  Indication for induction: Favorable cervix at term.  Patient had an uncomplicated labor course as follows: Membrane Rupture Time/Date: 12:56 PM ,10/29/2020   Delivery Method:Vaginal, Spontaneous  Episiotomy: None  Lacerations:  None  Details of delivery can be found in separate delivery note.  Patient had a routine postpartum course. Patient is discharged home 10/31/20.  Newborn Data: Birth date:10/29/2020  Birth time:1:16 PM  Gender:Female  Living status:Living  Apgars:9 ,9  Weight:3425 g   Magnesium Sulfate received: No BMZ received: No Rhophylac:N/A MMR:N/A T-DaP:Given prenatally Flu: N/A Transfusion:No  Physical exam  Vitals:   10/30/20 0353 10/30/20 1443 10/30/20 2010 10/31/20 0600  BP: (!) 104/57 100/61 (!) 123/56 128/62  Pulse: 65 78 85 84  Resp:  _0 Temp: 98 F (36.7 C) 97.6 F (36.4 C) 98.5 F (36.9 C) 97.9 F (36.6 C)  TempSrc: Oral Oral Oral Oral  SpO2: 100%  100%   Weight:      Height:       General: alert, cooperative and no distress Lochia: appropriate Uterine Fundus: firm Incision: N/A DVT Evaluation: No evidence of DVT seen on physical exam. Labs: Lab Results  Component Value Date    WBC 10.8 (H) 10/30/2020   HGB 10.3 (L) 10/30/2020   HCT 31.5 (L) 10/30/2020   MCV 85.8 10/30/2020   PLT 203 10/30/2020   CMP Latest Ref Rng & Units 10/08/2007  Glucose 70 - 99 mg/dL 92  BUN 6 - 23 mg/dL 7  Creatinine 0.4 - 1.2 mg/dL 0.7  Sodium 135 - 145 meq/L 141  Potassium 3.5 - 5.1 meq/L 4.1  Chloride 96 - 112 meq/L 105  CO2 19 - 32 meq/L 30  Calcium 8.4 - 10.5 mg/dL 9.3   Edinburgh Score: Edinburgh Postnatal Depression Scale Screening Tool 10/29/2020  I have been able to laugh and see the funny side of things. 0  I have looked forward with enjoyment to things. 0  I have blamed myself unnecessarily when things went wrong. 0  I have been anxious or worried for no good reason. 0  I have felt scared or panicky for no good reason. 0  Things have been getting on top of me. 0  I have been so unhappy that I have had difficulty sleeping. 0  I have felt sad or miserable. 0  I have been so unhappy that I have been crying. 0  The thought of harming myself has occurred to me. 0  Edinburgh Postnatal Depression Scale Total 0      After visit meds:   Ibuprofen 600 mg  1 po every 6 hours prn    Discharge home in stable condition Infant Feeding: Breast Infant Disposition:home with mother Discharge instruction: per After Visit Summary and Postpartum booklet. Activity: Advance as tolerated. Pelvic rest for 6 weeks.  Diet: routine diet Anticipated Birth Control: Plans Interval BTL Postpartum Appointment:2 weeks Additional Postpartum F/U: Postpartum Depression checkup Future Appointments:No future appointments. Follow up Visit:  Follow-up Information    Christophe Louis, MD. Schedule an appointment as soon as possible for a visit in 2 week(s).   Specialty: Obstetrics and Gynecology Why: please call to make an appt in 2 weeks for PPV to rule out postpartum depression  Contact information: [address] [place] [address] [phone]                    10/31/2020 Christophe Louis, MD

## 2021-01-24 ENCOUNTER — Ambulatory Visit (HOSPITAL_BASED_OUTPATIENT_CLINIC_OR_DEPARTMENT_OTHER): Admit: 2021-01-24 | Payer: 59 | Admitting: Obstetrics and Gynecology

## 2021-01-24 ENCOUNTER — Encounter (HOSPITAL_BASED_OUTPATIENT_CLINIC_OR_DEPARTMENT_OTHER): Payer: Self-pay

## 2021-01-24 SURGERY — LIGATION, FALLOPIAN TUBE, LAPAROSCOPIC
Anesthesia: Choice | Laterality: Bilateral

## 2021-02-16 ENCOUNTER — Other Ambulatory Visit: Payer: Self-pay | Admitting: *Deleted

## 2021-02-16 ENCOUNTER — Telehealth: Payer: Self-pay | Admitting: *Deleted

## 2021-02-16 DIAGNOSIS — I83813 Varicose veins of bilateral lower extremities with pain: Secondary | ICD-10-CM

## 2021-02-16 NOTE — Telephone Encounter (Signed)
Returning [name]'s telephone message about needing an appointment regarding her painful varicose veins.  [name] is an established patient that cancelled laser ablation and stab phlebectomy procedures 03-2020 (Dr. Darrick Penna) due to pregnancy. She has delivered and recovered from post partum period and wants to see MD and start 3 months of conservative treatment.  Dr. Darrick Penna is retiring mid- August and patient made aware.  Venous reflux (bilateral, order in Epic) and VV FU with Dr. Edilia Bo scheduled on Sept 7 for 2:00 PM and 3:20 PM.  Patient aware of appointments.

## 2021-04-11 ENCOUNTER — Ambulatory Visit (INDEPENDENT_AMBULATORY_CARE_PROVIDER_SITE_OTHER): Payer: 59 | Admitting: Vascular Surgery

## 2021-04-11 ENCOUNTER — Ambulatory Visit (HOSPITAL_COMMUNITY)
Admission: RE | Admit: 2021-04-11 | Discharge: 2021-04-11 | Disposition: A | Discharge status: Home / Self Care | Payer: 59 | Source: Ambulatory Visit | Attending: Vascular Surgery | Admitting: Vascular Surgery

## 2021-04-11 ENCOUNTER — Other Ambulatory Visit: Payer: Self-pay

## 2021-04-11 ENCOUNTER — Encounter: Payer: Self-pay | Admitting: Vascular Surgery

## 2021-04-11 VITALS — BP 114/76 | HR 72 | Temp 97.6°F | Resp 14 | Ht 66.0 in | Wt 211.0 lb

## 2021-04-11 DIAGNOSIS — I83813 Varicose veins of bilateral lower extremities with pain: Secondary | ICD-10-CM | POA: Diagnosis present

## 2021-04-11 DIAGNOSIS — I872 Venous insufficiency (chronic) (peripheral): Secondary | ICD-10-CM | POA: Diagnosis not present

## 2021-04-11 NOTE — Progress Notes (Signed)
REASON FOR VISIT:   Follow-up of painful varicose veins  MEDICAL ISSUES:   CHRONIC VENOUS INSUFFICIENCY: This patient has CEAP C2 venous disease.  We have discussed again the importance of intermittent leg elevation and the proper positioning for this.  I have encouraged her to continue to wear her thigh-high compression stockings with a gradient of 20 to 30 mmHg.  We discussed the importance of exercise specifically walking and water aerobics.  In addition I encouraged her to avoid prolonged sitting and standing.  We also discussed the importance of maintaining a healthy weight as central obesity especially can increase lower extremity venous pressure.  She is still having significant symptoms despite conservative measures and I think she would be a good candidate for laser ablation of the right great saphenous vein with 10-20 stabs.  I have discussed the indications for endovenous laser ablation of the right GSV, that is to lower the pressure in the veins and potentially help relieve the symptoms from venous hypertension.  I have also discussed alternative options such as conservative treatment as described above. I have discussed the potential complications of the procedure, including bleeding, bruising, leg swelling, deep venous thrombosis (<1% risk), or failure of the vein to close <1% risk).  I have also explained that venous insufficiency is a chronic disease, and that the patient is at risk for recurrent varicose veins in the future.  All of the patient's questions were encouraged and answered. They are agreeable to proceed.   I have discussed with the patient the indications for stab phlebectomy.  I have explained to the patient that that will have small scars from the stab incisions.  I explained that the other risks include bruising, bleeding, and phlebitis.    HPI:   [name] is a pleasant 35 y.o. female who was seen by Dr. Fabienne Bruns on 03/01/2020 for follow-up of  her symptomatic varicose veins.  She had previously been seen by Dr. Myra Gianotti.  Patient had no previous history of DVT.  Previous reflux study showed significant reflux in the right great saphenous vein with diameters ranging from 5 to 7 mm.  She also had some deep venous reflux.  The patient was felt to be a good candidate for laser ablation of the right great saphenous vein with greater than 20 stab phlebectomies.  However, she then became pregnant and has had a child.  She has no plans for further children and now wishes to pursue laser ablation of the right great saphenous vein and stab phlebectomies.  On my history the patient has significant aching pain and heaviness in both legs but especially on the right.  Her symptoms are worse at the end of the day.  Her symptoms are relieved with elevation.  She has been wearing her thigh-high compression stockings with a gradient of 20 to 30 mmHg and also wearing pantyhose style stockings which helped some but she still having significant symptoms.  She is had no previous venous procedures.  She has no previous history of DVT.  Past Medical History:  Diagnosis Date   Anxiety    Condyloma    Conversion disorder    Dysrhythmia    H/O psychosis    Hx of varicella    Wolff-Parkinson-White syndrome     Family History  Problem Relation Age of Onset   Hypertension Maternal Grandmother    Stroke Maternal Grandmother    Rheum arthritis Maternal Grandmother    Diabetes Maternal Grandmother  Heart disease Maternal Grandfather    Cancer Maternal Grandfather        prostate   Diabetes Mother        borderline   Asthma Mother    Stroke Mother    Hypertension Father    Stroke Father    Anxiety disorder Paternal Aunt    Depression Other     SOCIAL HISTORY: Social History   Tobacco Use   Smoking status: Never   Smokeless tobacco: Never  Substance Use Topics   Alcohol use: Yes    Comment: occas before pregnancy    Allergies  Allergen  Reactions   Fish Allergy Anaphylaxis   Shellfish Allergy Anaphylaxis   Augmentin [Amoxicillin-Pot Clavulanate] Hives and Other (See Comments)    infant reaction- can take zpaks   Orap [Pimozide] Other (See Comments)    Lock jaw    Current Outpatient Medications  Medication Sig Dispense Refill   cetirizine (ZYRTEC) 10 MG tablet Take 10 mg by mouth at bedtime.     ibuprofen (ADVIL) 600 MG tablet Take 1 tablet (600 mg total) by mouth every 6 (six) hours as needed. 30 tablet 0   montelukast (SINGULAIR) 10 MG tablet Take 10 mg by mouth at bedtime.     Prenatal Vit-Fe Fumarate-FA (PRENATAL MULTIVITAMIN) TABS Take 1 tablet by mouth daily.     No current facility-administered medications for this visit.    REVIEW OF SYSTEMS:  [X]  denotes positive finding, [ ]  denotes negative finding Cardiac  Comments:  Chest pain or chest pressure:    Shortness of breath upon exertion:    Short of breath when lying flat:    Irregular heart rhythm:        Vascular    Pain in calf, thigh, or hip brought on by ambulation:    Pain in feet at night that wakes you up from your sleep:     Blood clot in your veins:    Leg swelling:         Pulmonary    Oxygen at home:    Productive cough:     Wheezing:         Neurologic    Sudden weakness in arms or legs:     Sudden numbness in arms or legs:     Sudden onset of difficulty speaking or slurred speech:    Temporary loss of vision in one eye:     Problems with dizziness:         Gastrointestinal    Blood in stool:     Vomited blood:         Genitourinary    Burning when urinating:     Blood in urine:        Psychiatric    Major depression:         Hematologic    Bleeding problems:    Problems with blood clotting too easily:        Skin    Rashes or ulcers:        Constitutional    Fever or chills:     PHYSICAL EXAM:   Vitals:   04/11/21 1435  BP: 114/76  Pulse: 72  Resp: 14  Temp: 97.6 F (36.4 C)  TempSrc: Temporal  SpO2:  100%  Weight: 211 lb (95.7 kg)  Height: 5\' 6"  (1.676 m)    GENERAL: The patient is a well-nourished female, in no acute distress. The vital signs are documented above. CARDIAC: There is a regular rate and  rhythm.  VASCULAR: I do not detect carotid bruits. I could not palpate pedal pulses however she had a biphasic anterior tibial and posterior tibial signal bilaterally. She has some dilated varicose veins in her medial right calf which are under significant pressure as documented below.   She has dilated varicose veins in her left thigh and left leg as documented below also.      I did look at the right great saphenous vein myself with the SonoSite.  She has reflux from the saphenofemoral junction of the proximal calf.  The vein is dilated to 6 mm throughout.  We would likely cannulate the vein in the distal thigh.  PULMONARY: There is good air exchange bilaterally without wheezing or rales. ABDOMEN: Soft and non-tender with normal pitched bowel sounds.  MUSCULOSKELETAL: There are no major deformities or cyanosis. NEUROLOGIC: No focal weakness or paresthesias are detected. SKIN: There are no ulcers or rashes noted. PSYCHIATRIC: The patient has a normal affect.  DATA:    VENOUS DUPLEX: I have independently interpreted her venous duplex scan today of the right lower extremity.  There is no evidence of DVT.  There is deep venous reflux involving the common femoral vein and popliteal vein.  There is superficial venous reflux in the right great saphenous vein from the saphenofemoral junction of the proximal calf.  The vein is 6 mm in diameter throughout.  There are some reflux in the small saphenous vein at the level of the knee but the vein is not significantly dilated.  The patient has a vein of Giacomini.     Waverly Ferrari [facility] [phone]

## 2021-05-08 ENCOUNTER — Other Ambulatory Visit: Payer: Self-pay | Admitting: *Deleted

## 2021-05-08 DIAGNOSIS — I83811 Varicose veins of right lower extremities with pain: Secondary | ICD-10-CM

## 2021-07-12 ENCOUNTER — Other Ambulatory Visit: Payer: Self-pay | Admitting: *Deleted

## 2021-07-12 MED ORDER — LORAZEPAM 1 MG PO TABS: ORAL_TABLET | ORAL | 0 refills | Status: AC

## 2021-07-18 ENCOUNTER — Other Ambulatory Visit: Payer: Self-pay

## 2021-07-18 ENCOUNTER — Ambulatory Visit (INDEPENDENT_AMBULATORY_CARE_PROVIDER_SITE_OTHER): Payer: 59 | Admitting: Vascular Surgery

## 2021-07-18 ENCOUNTER — Encounter: Payer: Self-pay | Admitting: Vascular Surgery

## 2021-07-18 VITALS — BP 109/69 | HR 95 | Temp 98.4°F | Resp 16 | Ht 67.0 in | Wt 210.0 lb

## 2021-07-18 DIAGNOSIS — I83811 Varicose veins of right lower extremities with pain: Secondary | ICD-10-CM

## 2021-07-18 DIAGNOSIS — I872 Venous insufficiency (chronic) (peripheral): Secondary | ICD-10-CM | POA: Diagnosis not present

## 2021-07-18 HISTORY — PX: ENDOVENOUS ABLATION SAPHENOUS VEIN W/ LASER: SUR449

## 2021-07-18 NOTE — Progress Notes (Signed)
° °  Patient name: [name] MRN: [id number] DOB: May 07, 1986 Sex: female  REASON FOR VISIT: For laser ablation of the right great saphenous vein with 10-20 stabs  HPI: [name] is a 35 y.o. female who I last saw on 04/11/2021.  She has CEAP C2 venous disease.  She had failed conservative treatment and I felt that she was a candidate for laser ablation of the right great saphenous vein with 10-20 stabs.  Current Outpatient Medications  Medication Sig Dispense Refill   cetirizine (ZYRTEC) 10 MG tablet Take 10 mg by mouth at bedtime.     ibuprofen (ADVIL) 600 MG tablet Take 1 tablet (600 mg total) by mouth every 6 (six) hours as needed. 30 tablet 0   LORazepam (ATIVAN) 1 MG tablet Take 1 tablet tablet 30 minutes prior to leaving house on day of office surgery.  Bring second tablet with you to office on day of office surgery. 2 tablet 0   montelukast (SINGULAIR) 10 MG tablet Take 10 mg by mouth at bedtime.     Prenatal Vit-Fe Fumarate-FA (PRENATAL MULTIVITAMIN) TABS Take 1 tablet by mouth daily.     No current facility-administered medications for this visit.    PHYSICAL EXAM: Vitals:   07/18/21 0847  BP: 109/69  Pulse: 95  Resp: 16  Temp: 98.4 F (36.9 C)  TempSrc: Temporal  SpO2: 100%  Weight: 210 lb (95.3 kg)  Height: 5\' 7"  (1.702 m)    PROCEDURE: Laser ablation right great saphenous vein with 10-20 stabs  TECHNIQUE: The patient was taken to the exam room and I marked the dilated veins with the patient standing.  The patient was then placed supine.  I looked at the right great saphenous vein myself with the SonoSite and I felt that I could cannulate this in the distal thigh.  The right leg was prepped and draped in usual sterile fashion.  Under ultrasound guidance, after the skin was anesthetized, I cannulated the right great saphenous vein in the distal thigh with a micropuncture needle and a micropuncture sheath was introduced over the wire.  I then advanced  the J-wire to just below the saphenofemoral junction.  Next the 45 cm sheath was advanced over the wire and the wire and dilator removed.  I position the laser fiber at the end of the sheath and the sheath was retracted.  The laser fiber was positioned 2.5 cm distal to the saphenofemoral junction.  Tumescent anesthesia was then administered circumferentially around the vein.  Next the patient was placed in Trendelenburg.  We placed laser glasses on.  Laser ablation was performed of the right great saphenous vein from 2.5 cm distal to the saphenofemoral junction to the distal thigh.  50 J/cm was used at 7 W.  Attention was then turned to stab phlebectomies.  All the marked areas were anesthetized with tumescent anesthesia.  Approximately 12 small stab incisions were made and the vein was clipped brought above the skin and then grasped with a hemostat.  The veins were then bluntly excised.  Pressure was held for hemostasis.  Steri-Strips were applied.  A pressure dressing was applied.  The patient tolerated the procedure well.  She will return in 1 week for a follow-up duplex.  [facility] [place] [phone]

## 2021-07-18 NOTE — Progress Notes (Signed)
° ° °   Laser Ablation Procedure    Date: 07/18/2021   [name] DOB:06-10-1986  Consent signed: Yes      Surgeon: Cari Caraway MD   Procedure: Laser Ablation: right Greater Saphenous Vein  BP 109/69 (BP Location: Right Arm, Patient Position: Sitting, Cuff Size: Large)    Pulse 95    Temp 98.4 F (36.9 C) (Temporal)    Resp 16    Ht 5\' 7"  (1.702 m)    Wt 210 lb (95.3 kg)    SpO2 100%    BMI 32.89 kg/m   Tumescent Anesthesia: 425 cc 0.9% NaCl with 50 cc Lidocaine HCL 1%  and 15 cc 8.4% NaHCO3  Local Anesthesia: 5 cc Lidocaine HCL and NaHCO3 (ratio 2:1)  7 watts continuous mode     Total energy: 1469 Joules    Total time: 209 seconds  Treatment Length  30 cm   Laser Fiber Ref. #   Lot #  [id number]   Stab Phlebectomy: 10-20 Sites: Thigh and Calf  Patient tolerated procedure well  Notes: Patient wore face mask.  All staff members wore facial masks and facial shields/goggles.  [name] took Ativan 1 mg on 07-18-2021 at 8:00 AM.  Hibiclens used for procedural prep as [name] has adverse reaction to iodine/shellfish.    Description of Procedure:  After marking the course of the secondary varicosities, the patient was placed on the operating table in the supine position, and the right leg was prepped and draped in sterile fashion.   Local anesthetic was administered and under ultrasound guidance the saphenous vein was accessed with a micro needle and guide wire; then the mirco puncture sheath was placed.  A guide wire was inserted saphenofemoral junction , followed by a 5 french sheath.  The position of the sheath and then the laser fiber below the junction was confirmed using the ultrasound.  Tumescent anesthesia was administered along the course of the saphenous vein using ultrasound guidance. The patient was placed in Trendelenburg position and protective laser glasses were placed on patient and staff, and the laser was fired at 7 watts  continuous mode for a total of 1469  joules.   For stab phlebectomies, local anesthetic was administered at the previously marked varicosities, and tumescent anesthesia was administered around the vessels.  Ten to 20 stab wounds were made using the tip of an 11 blade. And using the vein hook, the phlebectomies were performed using a hemostat to avulse the varicosities.  Adequate hemostasis was achieved.     Steri strips were applied to the stab wounds and ABD pads and thigh high compression stockings were applied.  Ace wrap bandages were applied over the phlebectomy sites and at the top of the saphenofemoral junction. Blood loss was less than 15 cc.  Discharge instructions reviewed with patient and hardcopy of discharge instructions given to patient to take home. The patient ambulated out of the operating room having tolerated the procedure well.

## 2021-07-23 DIAGNOSIS — J3081 Allergic rhinitis due to animal (cat) (dog) hair and dander: Secondary | ICD-10-CM | POA: Insufficient documentation

## 2021-07-23 DIAGNOSIS — J309 Allergic rhinitis, unspecified: Secondary | ICD-10-CM | POA: Insufficient documentation

## 2021-07-23 DIAGNOSIS — J301 Allergic rhinitis due to pollen: Secondary | ICD-10-CM | POA: Insufficient documentation

## 2021-07-23 DIAGNOSIS — Z91013 Allergy to seafood: Secondary | ICD-10-CM | POA: Insufficient documentation

## 2021-07-25 ENCOUNTER — Other Ambulatory Visit: Payer: Self-pay

## 2021-07-25 ENCOUNTER — Ambulatory Visit (INDEPENDENT_AMBULATORY_CARE_PROVIDER_SITE_OTHER): Payer: Medicaid Other | Admitting: Vascular Surgery

## 2021-07-25 ENCOUNTER — Encounter: Payer: Self-pay | Admitting: Vascular Surgery

## 2021-07-25 ENCOUNTER — Ambulatory Visit (HOSPITAL_COMMUNITY)
Admission: RE | Admit: 2021-07-25 | Discharge: 2021-07-25 | Disposition: A | Discharge status: Home / Self Care | Payer: 59 | Source: Ambulatory Visit | Attending: Vascular Surgery | Admitting: Vascular Surgery

## 2021-07-25 VITALS — BP 130/88 | HR 66 | Temp 98.5°F

## 2021-07-25 DIAGNOSIS — I83811 Varicose veins of right lower extremities with pain: Secondary | ICD-10-CM | POA: Diagnosis not present

## 2021-07-25 DIAGNOSIS — I872 Venous insufficiency (chronic) (peripheral): Secondary | ICD-10-CM

## 2021-07-25 NOTE — Progress Notes (Signed)
° °  Patient name: [name] MRN: [id number] DOB: 1985/08/09 Sex: female  REASON FOR VISIT: Follow-up after laser ablation of the right great saphenous vein with 10-20 stabs.  HPI: [name] is a 35 y.o. female who presented with painful varicose veins of the right lower extremity.  She had C2 venous disease.  She had failed conservative treatment and was felt to be a candidate for laser ablation of the right great saphenous vein.  On 07/18/2021 she underwent laser ablation of the right great saphenous vein from 2-1/2 cm distal to the saphenofemoral junction to the distal thigh.  She also had 10-20 stabs.  She comes in for follow-up visit.  Today he has no specific complaints.  She has been elevating her legs some and wearing her thigh-high compression stocking.  Current Outpatient Medications  Medication Sig Dispense Refill   cetirizine (ZYRTEC) 10 MG tablet Take 10 mg by mouth at bedtime.     ibuprofen (ADVIL) 600 MG tablet Take 1 tablet (600 mg total) by mouth every 6 (six) hours as needed. 30 tablet 0   LORazepam (ATIVAN) 1 MG tablet Take 1 tablet tablet 30 minutes prior to leaving house on day of office surgery.  Bring second tablet with you to office on day of office surgery. 2 tablet 0   montelukast (SINGULAIR) 10 MG tablet Take 10 mg by mouth at bedtime.     Prenatal Vit-Fe Fumarate-FA (PRENATAL MULTIVITAMIN) TABS Take 1 tablet by mouth daily.     No current facility-administered medications for this visit.   REVIEW OF SYSTEMS: Arly.Keller ] denotes positive finding; [  ] denotes negative finding  CARDIOVASCULAR:  [ ]  chest pain   [ ]  dyspnea on exertion  [ ]  leg swelling  CONSTITUTIONAL:  [ ]  fever   [ ]  chills  PHYSICAL EXAM: Vitals:   07/25/21 1026  BP: 130/88  Pulse: 66  Temp: 98.5 F (36.9 C)  TempSrc: Skin  SpO2: 98%   GENERAL: The patient is a well-nourished female, in no acute distress. The vital signs are documented above. CARDIOVASCULAR: There is a  regular rate and rhythm. PULMONARY: There is good air exchange bilaterally without wheezing or rales. She has mild bruising in the medial right thigh.  Her Steri-Strips are still in place.  DATA:  VENOUS DUPLEX: I have independently interpreted her venous duplex scan today of the right lower extremity.  There is no evidence of DVT in the right lower extremity.  The right great saphenous vein is successfully closed from 1.08 cm distal to the saphenofemoral junction to the knee.  MEDICAL ISSUES:  S/P LASER ABLATION RIGHT GREAT SAPHENOUS VEIN WITH 10-20 STABS: Patient is doing well status post laser ablation of the right great saphenous vein with 10-20 stabs.  She will wear her thigh-high stockings for another week.  She is not having significant symptoms on the left.  We will see her back as needed.  [facility] [place] [phone]

## 2023-09-11 NOTE — Telephone Encounter (Signed)
 Called pt and pt stated she read Tirrany's message and no longer needs a further conversation

## 2023-11-17 ENCOUNTER — Other Ambulatory Visit: Payer: Self-pay

## 2023-11-17 DIAGNOSIS — I83893 Varicose veins of bilateral lower extremities with other complications: Secondary | ICD-10-CM

## 2024-01-06 ENCOUNTER — Ambulatory Visit (HOSPITAL_COMMUNITY)

## 2024-01-06 ENCOUNTER — Ambulatory Visit

## 2024-03-01 ENCOUNTER — Other Ambulatory Visit: Payer: Self-pay

## 2024-03-01 DIAGNOSIS — I83893 Varicose veins of bilateral lower extremities with other complications: Secondary | ICD-10-CM

## 2024-03-18 ENCOUNTER — Ambulatory Visit (HOSPITAL_COMMUNITY)
Admission: RE | Admit: 2024-03-18 | Discharge: 2024-03-18 | Disposition: A | Discharge status: Home / Self Care | Source: Ambulatory Visit | Attending: Vascular Surgery | Admitting: Vascular Surgery

## 2024-03-18 ENCOUNTER — Ambulatory Visit: Admitting: Physician Assistant

## 2024-03-18 VITALS — BP 115/80 | HR 93 | Temp 98.4°F

## 2024-03-18 DIAGNOSIS — I83893 Varicose veins of bilateral lower extremities with other complications: Secondary | ICD-10-CM | POA: Insufficient documentation

## 2024-03-18 DIAGNOSIS — I83891 Varicose veins of right lower extremities with other complications: Secondary | ICD-10-CM

## 2024-03-18 DIAGNOSIS — I8393 Asymptomatic varicose veins of bilateral lower extremities: Secondary | ICD-10-CM

## 2024-03-18 NOTE — Progress Notes (Signed)
 VASCULAR & VEIN SPECIALISTS           [place]  History and Physical   [name] is a 38 y.o. female who presents with LLE swelling.  She has hx of previous RLE venous laser ablation with Dr. Eliza in 2022 and got great results afterwards.  She was scheduled to f/u as needed.   She comes in today with complaints of left leg swelling and feeling of heaviness and achiness.  She states that she sometimes feels like the leg is so heavy that it is dragging.  She does not have hx of DVT.  She does have some skin color changes in her lower legs. She has family hx of venous disease on her father's side of the family.   She does elevate her legs occasionally and this does help.   She states she gets a pain in the left great toe occasionally like a pulling sensation.    She recently has had some palpitations and her PCP with [facility] has sent her for further evaluation for this.  They feel this may be due to sleep apnea and she has a sleep study ordered.    The pt is not on a statin for cholesterol management.  The pt is not on a daily aspirin.   Other AC:  none The pt is not on medication for hypertension.   The pt is not on medication for diabetes.   Tobacco hx:  never  Pt does not have family hx of AAA.  Past Medical History:  Diagnosis Date   Anxiety    Condyloma    Conversion disorder    Dysrhythmia    H/O psychosis    Hx of varicella    Wolff-Parkinson-White syndrome     Past Surgical History:  Procedure Laterality Date   ENDOVENOUS ABLATION SAPHENOUS VEIN W/ LASER Right 07/18/2021   endovenous laser ablation right greater saphenous vein and stab phlebectomy 10-20 incisions right leg by [place] Eliza MD   heart ablation     SHOULDER SURGERY     TONSILLECTOMY     WISDOM TOOTH EXTRACTION      Social History   Socioeconomic History   Marital status: Married    Spouse name: Not on file   Number of children: Not on file   Years of education:  Not on file   Highest education level: Not on file  Occupational History   Not on file  Tobacco Use   Smoking status: Never   Smokeless tobacco: Never  Substance and Sexual Activity   Alcohol use: Yes    Comment: occas before pregnancy   Drug use: No   Sexual activity: Yes  Other Topics Concern   Not on file  Social History Narrative   Not on file   Social Drivers of Health   Financial Resource Strain: Not on file  Food Insecurity: Low Risk  (02/11/2024)   Received from [facility]   Hunger Vital Sign    Within the past 12 months, you worried that your food would run out before you got money to buy more: Never true    Within the past 12 months, the food you bought just didn't last and you didn't have money to get more. : Never true  Transportation Needs: No Transportation Needs (09/01/2023)   Received from [facility]    In the past 12 months, has lack of reliable transportation kept you  from medical appointments, meetings, work or from getting things needed for daily living? : No  Physical Activity: Not on file  Stress: Not on file  Social Connections: Not on file  Intimate Partner Violence: Not on file     Family History  Problem Relation Age of Onset   Hypertension Maternal Grandmother    Stroke Maternal Grandmother    Rheum arthritis Maternal Grandmother    Diabetes Maternal Grandmother    Heart disease Maternal Grandfather    Cancer Maternal Grandfather        prostate   Diabetes Mother        borderline   Asthma Mother    Stroke Mother    Hypertension Father    Stroke Father    Anxiety disorder Paternal Aunt    Depression Other     Current Outpatient Medications  Medication Sig Dispense Refill   cetirizine (ZYRTEC) 10 MG tablet Take 10 mg by mouth at bedtime.     ibuprofen  (ADVIL ) 600 MG tablet Take 1 tablet (600 mg total) by mouth every 6 (six) hours as needed. 30 tablet 0   LORazepam  (ATIVAN ) 1 MG tablet Take 1 tablet tablet 30  minutes prior to leaving house on day of office surgery.  Bring second tablet with you to office on day of office surgery. 2 tablet 0   montelukast  (SINGULAIR ) 10 MG tablet Take 10 mg by mouth at bedtime.     Prenatal Vit-Fe Fumarate-FA (PRENATAL MULTIVITAMIN) TABS Take 1 tablet by mouth daily.     No current facility-administered medications for this visit.    Allergies  Allergen Reactions   Fish Allergy Anaphylaxis   Shellfish Allergy Anaphylaxis   Augmentin [Amoxicillin-Pot Clavulanate] Hives and Other (See Comments)    infant reaction- can take zpaks   Cephalexin Other (See Comments)    Other reaction(s): Unknown IBS    Orap [Pimozide] Other (See Comments)    Lock jaw    REVIEW OF SYSTEMS:   [X]  denotes positive finding, [ ]  denotes negative finding Cardiac  Comments:  Chest pain or chest pressure:    Shortness of breath upon exertion:    Short of breath when lying flat:    Irregular heart rhythm: x See HPI      Vascular    Pain in calf, thigh, or hip brought on by ambulation:    Pain in feet at night that wakes you up from your sleep:     Blood clot in your veins:    Leg swelling:  x       Pulmonary    Oxygen at home:    Productive cough:     Wheezing:         Neurologic    Sudden weakness in arms or legs:     Sudden numbness in arms or legs:     Sudden onset of difficulty speaking or slurred speech:    Temporary loss of vision in one eye:     Problems with dizziness:         Gastrointestinal    Blood in stool:     Vomited blood:         Genitourinary    Burning when urinating:     Blood in urine:        Psychiatric    Major depression:         Hematologic    Bleeding problems:    Problems with blood clotting too easily:  Skin    Rashes or ulcers:        Constitutional    Fever or chills:      PHYSICAL EXAMINATION:  Today's Vitals   03/18/24 1146  BP: 115/80  Pulse: 93  Temp: 98.4 F (36.9 C)  TempSrc: Temporal   There is no  height or weight on file to calculate BMI.   General:  WDWN in NAD; vital signs documented above Gait: Not observed HENT: WNL, normocephalic Pulmonary: normal non-labored breathing without wheezing Cardiac: regular HR; without carotid bruits Abdomen: soft, NT, aortic pulse is not palpable Skin: without rashes Vascular Exam/Pulses:  Right Left  Radial 2+ (normal) 2+ (normal)  DP 2+ (normal) 2+ (normal)   Extremities: large varicosity as pictured with + LLE swelling        Neurologic: A&O X 3;  moving all extremities equally Psychiatric:  The pt has Normal affect.   Non-Invasive Vascular Imaging:   Venous duplex on 03/18/2024: +--------------+---------+------+-----------+------------+--------+  LEFT         Reflux NoRefluxReflux TimeDiameter cmsComments                          Yes                                   +--------------+---------+------+-----------+------------+--------+  CFV                    yes   >1 second                       +--------------+---------+------+-----------+------------+--------+  FV mid        no                                              +--------------+---------+------+-----------+------------+--------+  Popliteal    no                                              +--------------+---------+------+-----------+------------+--------+  GSV at SFJ              yes    >500 ms      1.14              +--------------+---------+------+-----------+------------+--------+  GSV prox thigh          yes    >500 ms      .70               +--------------+---------+------+-----------+------------+--------+  GSV mid thigh           yes    >500 ms      .60               +--------------+---------+------+-----------+------------+--------+  GSV dist thigh          yes    >500 ms      .57               +--------------+---------+------+-----------+------------+--------+  GSV at knee             yes     >500 ms      .58               +--------------+---------+------+-----------+------------+--------+  GSV prox calf no                            .28               +--------------+---------+------+-----------+------------+--------+  GSV mid calf            yes    >500 ms      .25               +--------------+---------+------+-----------+------------+--------+  SSV at [facility]    no                            .30               +--------------+---------+------+-----------+------------+--------+  SSV prox calf no                            .20               +--------------+---------+------+-----------+------------+--------+   Summary:  Left:  - No evidence of deep vein thrombosis seen in the left lower extremity,  from the common femoral through the popliteal veins.  - No evidence of superficial venous thrombosis in the left lower  extremity.  - Venous reflux is noted in the left common femoral vein.  - Venous reflux is noted in the left sapheno-femoral junction.  - Venous reflux is noted in the left greater saphenous vein in the thigh.  - Venous reflux is noted in the left greater saphenous vein in the calf.     Damien L Ledwell is a 38 y.o. female who presents with: LLE swelling and hx of RLE venous laser ablation in 2022 by Dr. Eliza    -pt has easily palpable DP pedal pulses bilaterally -in the left lower extremity, the pt does not have evidence of DVT.  Pt does have venous reflux in the deep CFV as well as the GSV at the SFJ throughout the thigh and in the mid calf.  The vein measures 0.57cm-0.70cm. -discussed with pt about wearing thigh high 20-30 mmHg compression stockings and pt was measured for these today.    -discussed the importance of leg elevation and how to elevate properly - pt is advised to elevate their legs and a diagram is given to them to demonstrate for pt to lay flat on their back with knees elevated and slightly bent with their  feet higher than their knees, which puts their feet higher than their heart for 15 minutes per day.  If pt cannot lay flat, advised to lay as flat as possible.  -pt is advised to continue as much walking as possible and avoid sitting or standing for long periods of time.  -discussed importance of weight loss and exercise and that water aerobics would also be beneficial.  -handout with recommendations given -pt will f/u with in the next few months with either Dr. Serene or Dr. Sheree for further evaluation for LLE venous laser ablation and she understands that it may be several months before being seen.     Lucie Apt, [facility] Vein Specialists [phone]  Clinic MD:  [place]

## 2024-06-28 ENCOUNTER — Other Ambulatory Visit (HOSPITAL_COMMUNITY)
Admission: RE | Admit: 2024-06-28 | Discharge: 2024-06-28 | Disposition: A | Source: Ambulatory Visit | Attending: Obstetrics and Gynecology | Admitting: Obstetrics and Gynecology

## 2024-06-28 ENCOUNTER — Other Ambulatory Visit: Payer: Self-pay | Admitting: Obstetrics and Gynecology

## 2024-06-28 DIAGNOSIS — Z01419 Encounter for gynecological examination (general) (routine) without abnormal findings: Secondary | ICD-10-CM | POA: Diagnosis present

## 2024-06-30 LAB — CYTOLOGY - PAP
Comment: NEGATIVE
Diagnosis: NEGATIVE
High risk HPV: NEGATIVE

## 2024-07-05 ENCOUNTER — Ambulatory Visit: Attending: Surgery | Admitting: Surgery

## 2024-07-05 ENCOUNTER — Encounter: Payer: Self-pay | Admitting: Surgery

## 2024-07-05 VITALS — BP 111/79 | HR 76 | Temp 98.2°F | Ht 67.0 in | Wt 217.0 lb

## 2024-07-05 DIAGNOSIS — I83892 Varicose veins of left lower extremities with other complications: Secondary | ICD-10-CM | POA: Diagnosis not present

## 2024-07-05 NOTE — Progress Notes (Signed)
 Vascular and Vein Specialist of [facility]  Patient name: [name] MRN: [id number] DOB: [birth year] Sex: female   REASON FOR VISIT:    Follow-up varicose vein  HISOTRY OF PRESENT ILLNESS:    [name] is a 38 y.o. female who was seen in August of this year for left leg swelling.  She has a history of venous ablation of the right leg by Dr. Eliza in 2022.  She complains of swelling and heaviness as well as aching in her left leg.  Sometimes she feels like it is so heavy that she has to drag it.  She denies any history of DVT.  She does report some skin changes on her legs.  She has positive family history of venous disease on her father side.  At her last visit she was placed in thigh-high 20 to 30 mm compression.  She was encouraged to keep her legs elevated and continue a walking program.  Weight loss was also discussed.  She continues to have heaviness and discomfort in her leg.   PAST MEDICAL HISTORY:   Past Medical History:  Diagnosis Date   Anxiety    Condyloma    Conversion disorder    Dysrhythmia    H/O psychosis    Hx of varicella    Wolff-Parkinson-White syndrome      FAMILY HISTORY:   Family History  Problem Relation Age of Onset   Hypertension Maternal Grandmother    Stroke Maternal Grandmother    Rheum arthritis Maternal Grandmother    Diabetes Maternal Grandmother    Heart disease Maternal Grandfather    Cancer Maternal Grandfather        prostate   Diabetes Mother        borderline   Asthma Mother    Stroke Mother    Hypertension Father    Stroke Father    Anxiety disorder Paternal Aunt    Depression Other     SOCIAL HISTORY:   Social History   Tobacco Use   Smoking status: Never   Smokeless tobacco: Never  Substance Use Topics   Alcohol use: Yes    Comment: occas before pregnancy     ALLERGIES:   Allergies  Allergen Reactions   Fish Allergy Anaphylaxis   Shellfish  Allergy Anaphylaxis   Augmentin [Amoxicillin-Pot Clavulanate] Hives and Other (See Comments)    infant reaction- can take zpaks   Cephalexin Other (See Comments)    Other reaction(s): Unknown IBS    Orap [Pimozide] Other (See Comments)    Lock jaw     CURRENT MEDICATIONS:   Current Outpatient Medications  Medication Sig Dispense Refill   cetirizine (ZYRTEC) 10 MG tablet Take 10 mg by mouth at bedtime.     ibuprofen  (ADVIL ) 600 MG tablet Take 1 tablet (600 mg total) by mouth every 6 (six) hours as needed. 30 tablet 0   Magnesium Gluconate (MAGNESIUM 27 PO) Take 500 mg by mouth.     montelukast  (SINGULAIR ) 10 MG tablet Take 10 mg by mouth at bedtime.     Prenatal Vit-Fe Fumarate-FA (PRENATAL MULTIVITAMIN) TABS Take 1 tablet by mouth daily.     LORazepam  (ATIVAN ) 1 MG tablet Take 1 tablet tablet 30 minutes prior to leaving house on day of office surgery.  Bring second tablet with you to office on day of office surgery. (Patient not taking: Reported on 07/05/2024) 2 tablet 0   No current facility-administered medications for this visit.    REVIEW OF SYSTEMS:   [  X] denotes positive finding, [ ]  denotes negative finding Cardiac  Comments:  Chest pain or chest pressure:    Shortness of breath upon exertion:    Short of breath when lying flat:    Irregular heart rhythm:        Vascular    Pain in calf, thigh, or hip brought on by ambulation:    Pain in feet at night that wakes you up from your sleep:     Blood clot in your veins:    Leg swelling:  x       Pulmonary    Oxygen at home:    Productive cough:     Wheezing:         Neurologic    Sudden weakness in arms or legs:     Sudden numbness in arms or legs:     Sudden onset of difficulty speaking or slurred speech:    Temporary loss of vision in one eye:     Problems with dizziness:         Gastrointestinal    Blood in stool:     Vomited blood:         Genitourinary    Burning when urinating:     Blood in urine:         Psychiatric    Major depression:         Hematologic    Bleeding problems:    Problems with blood clotting too easily:        Skin    Rashes or ulcers:        Constitutional    Fever or chills:      PHYSICAL EXAM:   Vitals:   07/05/24 0837  BP: 111/79  Pulse: 76  Temp: 98.2 F (36.8 C)  SpO2: 98%  Weight: 217 lb (98.4 kg)  Height: 5' 7 (1.702 m)    GENERAL: The patient is a well-nourished female, in no acute distress. The vital signs are documented above. CARDIAC: There is a regular rate and rhythm.  VASCULAR: SonoSite was used to evaluate her left great saphenous vein which is markedly dilated and straight with multiple branches.  Hyperpigmentation of the medial lower leg PULMONARY: Non-labored respirations MUSCULOSKELETAL: There are no major deformities or cyanosis. NEUROLOGIC: No focal weakness or paresthesias are detected. SKIN: There are no ulcers or rashes noted. PSYCHIATRIC: The patient has a normal affect.       STUDIES:   I have reviewed the following reflux study:  +--------------+---------+------+-----------+------------+--------+  LEFT         Reflux NoRefluxReflux TimeDiameter cmsComments                          Yes                                   +--------------+---------+------+-----------+------------+--------+  CFV                    yes   >1 second                       +--------------+---------+------+-----------+------------+--------+  FV mid        no                                              +--------------+---------+------+-----------+------------+--------+  Popliteal    no                                              +--------------+---------+------+-----------+------------+--------+  GSV at [facility]              yes    >500 ms      1.14              +--------------+---------+------+-----------+------------+--------+  GSV prox thigh          yes    >500 ms      .70                +--------------+---------+------+-----------+------------+--------+  GSV mid thigh           yes    >500 ms      .60               +--------------+---------+------+-----------+------------+--------+  GSV dist thigh          yes    >500 ms      .57               +--------------+---------+------+-----------+------------+--------+  GSV at knee             yes    >500 ms      .58               +--------------+---------+------+-----------+------------+--------+  GSV prox calf no                            .28               +--------------+---------+------+-----------+------------+--------+  GSV mid calf            yes    >500 ms      .25               +--------------+---------+------+-----------+------------+--------+  SSV at [facility]    no                            .30               +--------------+---------+------+-----------+------------+--------+  SSV prox calf no                            .20               +--------------+---------+------+-----------+------------+--------+          MEDICAL ISSUES:   CEAP class IV, left leg: The patient has attempted conservative measures including leg elevation, compression therapy, and exercise and still is having persistent symptoms of heaviness and discomfort.  We discussed proceeding with endovenous laser ablation of the left great saphenous vein beginning around the knee with greater than 20 stabs to address her varicosities.  The details of the procedure were discussed with the patient.  All questions were answered.    Malvina Serene CLORE, MD, FACS Vascular and Vein Specialists of [facility] [phone] Pager [phone]

## 2024-08-03 NOTE — Progress Notes (Signed)
 " [address] - AMBULATORY [facility]  - FAMILY MEDICINE [facility] [address] [place] [address]    Date of service:  08/03/2024  Name:  [name]  Date of Birth:  [birth year]   SUBJECTIVE   Patient ID: [name] is a 38 y.o. (DOB 1986-05-28) female  Chief Complaint  Patient presents with   URI    The patient was last seen 09/01/23.  Upper Respiratory Infection: Patient complains of symptoms of a URI. Symptoms include left ear pain and ringing, congestion, cough, sore throat, and swollen glands. Onset of symptoms was 5 days ago, gradually worsening since that time. She also c/o cough described as productive, lightheadedness, sinus pressure, sneezing, and wheezing for the past 5 days.  She has pain and pressure beneath her eyes, cheek, above her eyes.  She is drinking plenty of fluids. Evaluation to date: none. Treatment to date: antihistamines and robutussin, no relief. Pain scale: 4/10.  Pt states that child was dx with strep throat yesterday.   Review of Systems  All other pertinent systems reviewed and are negative.  Social History[1]   The following portions of the patient's history were reviewed and updated as appropriate: allergies, current medications, past family history, past medical history, past social history, past surgical history and problem list.  OBJECTIVE   Vitals:   08/03/24 0811  BP: 108/80  Pulse: 80  Resp: 16  Temp: 97.3 F (36.3 C)  TempSrc: Temporal  SpO2: 98%  Weight: 99.2 kg (218 lb 9.6 oz)  Height: 1.702 m (5' 7)    Body mass index is 34.24 kg/m. Constitutional: Alert - NAD. Appears well-developed and well nourished. Bilateral TMs dull.  OP clear, no injection or exudate, moist mucous membranes. No cervical LAD. Cardiovascular: Normal rate and rhythm.  Exam reveals no gallop and no friction rub.  No murmur heard.  Pulmonary/chest: Effort normal and breath  sounds normal. No wheezes. No rales.    ASSESSMENT/PLAN   Problem List Items Addressed This Visit   None Visit Diagnoses       Sore throat    -  Primary   Relevant Orders   POC Rapid Strep A (Completed)     Acute non-recurrent pansinusitis       Relevant Medications   azithromycin (ZITHROMAX) 250 mg tablet     RST negative. Tx for bacterial sinusitis, RTC if symptoms do not resolve with treatment. Risks, benefits, and alternatives of the medication(s) and treatment plan(s) were discussed, and she expressed understanding. No barriers to treatment identified in this visit.   Return for next scheduled office visit.    Current Outpatient Medications  Medication Instructions   azelastine (ASTELIN) 137 mcg (0.1 %) nasal spray 2 sprays, 2 times daily   azithromycin (ZITHROMAX) 250 mg tablet Take 2 tablets (500 mg total) by mouth daily for 1 day, THEN 1 tablet (250 mg total) daily for 4 days.   EPINEPHrine  (EPIPEN ) 0.3 mg/0.3 mL injection syringe into the thigh.   iron,carb/vit C/vit B12/folic (IRON 100 PLUS ORAL) 100 mg, Daily   levocetirizine (XYZAL) 5 mg, Every evening   MAGNESIUM ORAL 500 mg, 3 times weekly   montelukast  (SINGULAIR ) 10 mg, Daily   This document serves as a record of services personally performed by Madelin Brought, MD.  It was created on their behalf by Gwenlyn Cable, CMA, a trained medical scribe, and Certified Medical Assistant (CMA). During the course of documenting the  history, physical exam and medical decision making, I was functioning as a stage manager. The creation of this record is the providers dictation and/or activities during the visit.   Electronically signed by: Gwenlyn Cable, CMA 08/03/2024 8:16 AM    I agree the documentation is accurate and complete.  Electronically signed by: Tammy Rachel Brought, MD 08/03/2024 8:40 AM          [1] Social History Tobacco Use   Smoking status: Never   Smokeless tobacco: Never  Substance Use Topics    Alcohol use: Not Currently  "

## 2024-08-12 ENCOUNTER — Other Ambulatory Visit: Payer: Self-pay

## 2024-08-12 DIAGNOSIS — I83892 Varicose veins of left lower extremities with other complications: Secondary | ICD-10-CM

## 2024-10-14 ENCOUNTER — Other Ambulatory Visit: Admitting: Surgery

## 2024-10-28 ENCOUNTER — Ambulatory Visit (HOSPITAL_COMMUNITY)

## 2024-10-28 ENCOUNTER — Encounter: Admitting: Surgery
